# Patient Record
Sex: Female | Born: 1980 | Race: White | Hispanic: No | Marital: Married | Smoking: Never smoker
Health system: Southern US, Community
[De-identification: ages and names within clinical notes are randomized; demographics above are authoritative.]

## PROBLEM LIST (undated history)

## (undated) ENCOUNTER — Inpatient Hospital Stay (HOSPITAL_COMMUNITY): Payer: Self-pay

## (undated) DIAGNOSIS — L509 Urticaria, unspecified: Secondary | ICD-10-CM

## (undated) DIAGNOSIS — L309 Dermatitis, unspecified: Secondary | ICD-10-CM

## (undated) HISTORY — PX: WISDOM TOOTH EXTRACTION: SHX21

## (undated) HISTORY — DX: Urticaria, unspecified: L50.9

## (undated) HISTORY — PX: TYMPANOSTOMY TUBE PLACEMENT: SHX32

## (undated) HISTORY — DX: Dermatitis, unspecified: L30.9

---

## 2011-04-18 ENCOUNTER — Inpatient Hospital Stay (HOSPITAL_COMMUNITY): Admission: AD | Admit: 2011-04-18 | Payer: Self-pay | Admitting: Obstetrics and Gynecology

## 2011-04-26 ENCOUNTER — Inpatient Hospital Stay (HOSPITAL_COMMUNITY)
Admission: AD | Admit: 2011-04-26 | Discharge: 2011-04-29 | DRG: 775 | Disposition: A | Discharge status: Home / Self Care | Payer: Managed Care, Other (non HMO) | Source: Ambulatory Visit | Attending: Obstetrics and Gynecology | Admitting: Obstetrics and Gynecology

## 2011-04-26 DIAGNOSIS — O480 Post-term pregnancy: Principal | ICD-10-CM | POA: Diagnosis present

## 2011-04-26 LAB — CBC
HCT: 32.7 % — ABNORMAL LOW (ref 36.0–46.0)
Hemoglobin: 10.9 g/dL — ABNORMAL LOW (ref 12.0–15.0)
MCH: 28.5 pg (ref 26.0–34.0)
MCHC: 33.3 g/dL (ref 30.0–36.0)
MCV: 85.6 fL (ref 78.0–100.0)

## 2011-04-28 LAB — CBC
HCT: 28.5 % — ABNORMAL LOW (ref 36.0–46.0)
MCH: 28 pg (ref 26.0–34.0)
MCV: 86.9 fL (ref 78.0–100.0)
RDW: 13.7 % (ref 11.5–15.5)
WBC: 12.1 10*3/uL — ABNORMAL HIGH (ref 4.0–10.5)

## 2014-12-14 NOTE — L&D Delivery Note (Signed)
Pt was admitted in early labor. She had AROM with clear fluid. She had pit aug. She completed the first stage without difficulty. She pushed breifly and had a SVD of one live viable white female infant over a second degree midline tear in the ROA position. Placenta-M/I. EBL-400cc. Baby to nbn. Tear closed with 3-0 chromic.

## 2015-03-05 LAB — OB RESULTS CONSOLE GC/CHLAMYDIA
CHLAMYDIA, DNA PROBE: NEGATIVE
GC PROBE AMP, GENITAL: NEGATIVE

## 2015-04-03 LAB — OB RESULTS CONSOLE HEPATITIS B SURFACE ANTIGEN: HEP B S AG: NEGATIVE

## 2015-04-03 LAB — OB RESULTS CONSOLE ANTIBODY SCREEN: ANTIBODY SCREEN: NEGATIVE

## 2015-04-03 LAB — OB RESULTS CONSOLE RUBELLA ANTIBODY, IGM: Rubella: IMMUNE

## 2015-04-03 LAB — OB RESULTS CONSOLE ABO/RH: RH Type: POSITIVE

## 2015-04-03 LAB — OB RESULTS CONSOLE RPR: RPR: NONREACTIVE

## 2015-04-03 LAB — OB RESULTS CONSOLE HIV ANTIBODY (ROUTINE TESTING): HIV: NONREACTIVE

## 2015-08-21 ENCOUNTER — Encounter (HOSPITAL_COMMUNITY): Payer: Self-pay | Admitting: *Deleted

## 2015-08-21 ENCOUNTER — Inpatient Hospital Stay (HOSPITAL_COMMUNITY)
Admission: AD | Admit: 2015-08-21 | Discharge: 2015-08-22 | Disposition: A | Discharge status: Home / Self Care | Payer: BC Managed Care – PPO | Source: Ambulatory Visit | Attending: Obstetrics and Gynecology | Admitting: Obstetrics and Gynecology

## 2015-08-21 DIAGNOSIS — Z3A31 31 weeks gestation of pregnancy: Secondary | ICD-10-CM | POA: Insufficient documentation

## 2015-08-21 DIAGNOSIS — R12 Heartburn: Secondary | ICD-10-CM | POA: Insufficient documentation

## 2015-08-21 DIAGNOSIS — O26893 Other specified pregnancy related conditions, third trimester: Secondary | ICD-10-CM

## 2015-08-21 DIAGNOSIS — O219 Vomiting of pregnancy, unspecified: Secondary | ICD-10-CM

## 2015-08-21 DIAGNOSIS — O212 Late vomiting of pregnancy: Secondary | ICD-10-CM | POA: Diagnosis present

## 2015-08-21 LAB — COMPREHENSIVE METABOLIC PANEL
ALBUMIN: 3.2 g/dL — AB (ref 3.5–5.0)
ALK PHOS: 73 U/L (ref 38–126)
ALT: 37 U/L (ref 14–54)
ANION GAP: 12 (ref 5–15)
AST: 36 U/L (ref 15–41)
BILIRUBIN TOTAL: 0.5 mg/dL (ref 0.3–1.2)
BUN: 9 mg/dL (ref 6–20)
CALCIUM: 8.9 mg/dL (ref 8.9–10.3)
CO2: 20 mmol/L — AB (ref 22–32)
Chloride: 105 mmol/L (ref 101–111)
Creatinine, Ser: 0.52 mg/dL (ref 0.44–1.00)
GFR calc Af Amer: >60 mL/min (ref >60)
GFR calc non Af Amer: >60 mL/min (ref >60)
GLUCOSE: 95 mg/dL (ref 65–99)
POTASSIUM: 3.7 mmol/L (ref 3.5–5.1)
SODIUM: 137 mmol/L (ref 135–145)
TOTAL PROTEIN: 6.6 g/dL (ref 6.5–8.1)

## 2015-08-21 LAB — CBC WITH DIFFERENTIAL/PLATELET
BASOS ABS: 0 10*3/uL (ref 0.0–0.1)
BASOS PCT: 0 % (ref 0–1)
EOS ABS: 0.2 10*3/uL (ref 0.0–0.7)
Eosinophils Relative: 2 % (ref 0–5)
HEMATOCRIT: 33.5 % — AB (ref 36.0–46.0)
HEMOGLOBIN: 11.3 g/dL — AB (ref 12.0–15.0)
Lymphocytes Relative: 9 % — ABNORMAL LOW (ref 12–46)
Lymphs Abs: 0.9 10*3/uL (ref 0.7–4.0)
MCH: 29.7 pg (ref 26.0–34.0)
MCHC: 33.7 g/dL (ref 30.0–36.0)
MCV: 87.9 fL (ref 78.0–100.0)
Monocytes Absolute: 0.4 10*3/uL (ref 0.1–1.0)
Monocytes Relative: 4 % (ref 3–12)
NEUTROS ABS: 9.5 10*3/uL — AB (ref 1.7–7.7)
NEUTROS PCT: 85 % — AB (ref 43–77)
Platelets: 224 10*3/uL (ref 150–400)
RBC: 3.81 MIL/uL — ABNORMAL LOW (ref 3.87–5.11)
RDW: 19.3 % — ABNORMAL HIGH (ref 11.5–15.5)
WBC: 11 10*3/uL — AB (ref 4.0–10.5)

## 2015-08-21 LAB — URINE MICROSCOPIC-ADD ON

## 2015-08-21 LAB — URINALYSIS, ROUTINE W REFLEX MICROSCOPIC
BILIRUBIN URINE: NEGATIVE
Glucose, UA: NEGATIVE mg/dL
Hgb urine dipstick: NEGATIVE
LEUKOCYTES UA: NEGATIVE
NITRITE: NEGATIVE
PROTEIN: 30 mg/dL — AB
Specific Gravity, Urine: >1.03 — ABNORMAL HIGH (ref 1.005–1.030)
UROBILINOGEN UA: 0.2 mg/dL (ref 0.0–1.0)
pH: 6 (ref 5.0–8.0)

## 2015-08-21 MED ORDER — FAMOTIDINE IN NACL 20-0.9 MG/50ML-% IV SOLN
20.0000 mg | Freq: Once | INTRAVENOUS | Status: AC
  Administered 2015-08-21: 20 mg via INTRAVENOUS
  Filled 2015-08-21: qty 50

## 2015-08-21 MED ORDER — LACTATED RINGERS IV BOLUS (SEPSIS)
1000.0000 mL | Freq: Once | INTRAVENOUS | Status: AC
  Administered 2015-08-21: 1000 mL via INTRAVENOUS

## 2015-08-21 MED ORDER — METOCLOPRAMIDE HCL 5 MG/ML IJ SOLN
10.0000 mg | Freq: Once | INTRAMUSCULAR | Status: AC
  Administered 2015-08-21: 10 mg via INTRAVENOUS
  Filled 2015-08-21: qty 2

## 2015-08-21 MED ORDER — ONDANSETRON HCL 4 MG/2ML IJ SOLN
4.0000 mg | Freq: Once | INTRAMUSCULAR | Status: AC
  Administered 2015-08-21: 4 mg via INTRAVENOUS
  Filled 2015-08-21: qty 2

## 2015-08-21 NOTE — MAU Note (Signed)
Pt states she has been vomiting since 1 pm. Denies diarrhea or fever.

## 2015-08-21 NOTE — MAU Provider Note (Signed)
History     CSN: [id number]  Arrival date and time: 08/21/15 2031   First Provider Initiated Contact with Patient 08/21/15 2123      No chief complaint on file.  HPI Charlotte Murray is a 34 y.o. G3P1011 at [redacted]w[redacted]d who presents to MAU today with complaint of N/V since 1300 today. She states that she had decreased appetite earlier in the pregnancy and was taking Diclegis. She has had minimal N/V. She denies change in diet or sick contacts. She does endorse intermittent heartburn aggravated by certain foods. She has tried pepcid chewables, but no pain medication. She denies fever, contractions, vaginal bleeding, LOF, UTI symptoms or complications with the pregnancy. She reports good fetal movement.   OB History    Gravida Para Term Preterm AB TAB SAB Ectopic Multiple Living   0 1 0 1 0 0 1      History reviewed. No pertinent past medical history.  History reviewed. No pertinent past surgical history.  No family history on file.  Social History  Substance Use Topics  . Smoking status: Never Smoker   . Smokeless tobacco: None  . Alcohol Use: No    Allergies: No Known Allergies  No prescriptions prior to admission    Review of Systems  Constitutional: Negative for fever and malaise/fatigue.  Gastrointestinal: Positive for nausea, vomiting and abdominal pain. Negative for diarrhea and constipation.  Genitourinary: Negative for dysuria, urgency and frequency.       Neg - vaginal bleeding, discharge, LOF   Physical Exam   Blood pressure 113/69, pulse 113, temperature 98.5 F (36.9 C), temperature source Oral, resp. rate 16, height  (1.6 m), weight 197 lb (89.359 kg), last menstrual period 08/18/2015, SpO2 99 %.  Physical Exam  Nursing note and vitals reviewed. Constitutional: She is oriented to person, place, and time. She appears well-developed and well-nourished. No distress.  HENT:  Head: Normocephalic and atraumatic.  Cardiovascular: Normal rate.    Respiratory: Effort normal.  GI: Soft. Bowel sounds are normal. She exhibits no distension and no mass. There is no tenderness. There is no rebound and no guarding.  Neurological: She is alert and oriented to person, place, and time.  Skin: Skin is warm and dry. No erythema.  Psychiatric: She has a normal mood and affect.    Results for orders placed or performed during the hospital encounter of 08/21/15 (from the past 24 hour(s))  Urinalysis, Routine w reflex microscopic (not at [facility])     Status: Abnormal   Collection Time: 08/21/15  8:52 PM  Result Value Ref Range   Color, Urine YELLOW YELLOW   APPearance CLEAR CLEAR   Specific Gravity, Urine >1.030 (H) 1.005 - 1.030   pH 6.0 5.0 - 8.0   Glucose, UA NEGATIVE NEGATIVE mg/dL   Hgb urine dipstick NEGATIVE NEGATIVE   Bilirubin Urine NEGATIVE NEGATIVE   Ketones, ur >80 (A) NEGATIVE mg/dL   Protein, ur 30 (A) NEGATIVE mg/dL   Urobilinogen, UA 0.2 0.0 - 1.0 mg/dL   Nitrite NEGATIVE NEGATIVE   Leukocytes, UA NEGATIVE NEGATIVE  Urine microscopic-add on     Status: Abnormal   Collection Time: 08/21/15  8:52 PM  Result Value Ref Range   Squamous Epithelial / LPF FEW (A) RARE   WBC, UA 0-2 <3 WBC/hpf   RBC / HPF 0-2 <3 RBC/hpf   Bacteria, UA MANY (A) RARE   Urine-Other MUCOUS PRESENT   CBC with Differential/Platelet     Status: Abnormal  Collection Time: 08/21/15  9:43 PM  Result Value Ref Range   WBC 11.0 (H) 4.0 - 10.5 K/uL   RBC 3.81 (L) 3.87 - 5.11 MIL/uL   Hemoglobin 11.3 (L) 12.0 - 15.0 g/dL   HCT 46.9 (L) 62.9 - 52.8 %   MCV 87.9 78.0 - 100.0 fL   MCH 29.7 26.0 - 34.0 pg   MCHC 33.7 30.0 - 36.0 g/dL   RDW 41.3 (H) 24.4 - 01.0 %   Platelets 224 150 - 400 K/uL   Neutrophils Relative % 85 (H) 43 - 77 %   Neutro Abs 9.5 (H) 1.7 - 7.7 K/uL   Lymphocytes Relative 9 (L) 12 - 46 %   Lymphs Abs 0.9 0.7 - 4.0 K/uL   Monocytes Relative 4 3 - 12 %   Monocytes Absolute 0.4 0.1 - 1.0 K/uL   Eosinophils Relative 2 0 - 5 %    Eosinophils Absolute 0.2 0.0 - 0.7 K/uL   Basophils Relative 0 0 - 1 %   Basophils Absolute 0.0 0.0 - 0.1 K/uL  Comprehensive metabolic panel     Status: Abnormal   Collection Time: 08/21/15  9:43 PM  Result Value Ref Range   Sodium 137 135 - 145 mmol/L   Potassium 3.7 3.5 - 5.1 mmol/L   Chloride 105 101 - 111 mmol/L   CO2 20 (L) 22 - 32 mmol/L   Glucose, Bld 95 65 - 99 mg/dL   BUN 9 6 - 20 mg/dL   Creatinine, Ser 2.72 0.44 - 1.00 mg/dL   Calcium 8.9 8.9 - 53.6 mg/dL   Total Protein 6.6 6.5 - 8.1 g/dL   Albumin 3.2 (L) 3.5 - 5.0 g/dL   AST 36 15 - 41 U/L   ALT 37 14 - 54 U/L   Alkaline Phosphatase 73 38 - 126 U/L   Total Bilirubin 0.5 0.3 - 1.2 mg/dL   GFR calc non Af Amer >60 >60 mL/min   GFR calc Af Amer >60 >60 mL/min   Anion gap 12 5 - 15   Fetal Monitoring: Baseline: 135 bpm, moderate variability, + accelerations, no decelerations Contractions: none  MAU Course  Procedures None  MDM IV LR with 4 mg IV Zofran and 20 mg IV Pepcid given Patient reports some improvement in symptoms, but continued N/V Reglan IV given.  Patient states resolution of nausea, but has continued to have small amount of emesis.  Discussed patient with Dr. Claiborne Billings. She recommends Rx for Reglan and continued Pepcid OTC. Patient may follow-up as scheduled unless symptoms worsen or she develops fever.   Assessment and Plan  A: SIUP at [redacted]w[redacted]d Heartburn in pregnancy Nausea and vomiting in pregnancy  P: Discharge home Rx for Reglan given to patient Advised continued OTC Pepcid Warning signs for worsening condition discussed Patient advised to follow-up with [facility] as scheduled for routine prenatal care or sooner PRN Patient may return to MAU as needed or if her condition were to change or worsen   Marny Lowenstein, PA-C  08/22/2015, 2:30 AM

## 2015-08-22 DIAGNOSIS — O219 Vomiting of pregnancy, unspecified: Secondary | ICD-10-CM

## 2015-08-22 MED ORDER — METOCLOPRAMIDE HCL 10 MG PO TABS: 10.0000 mg | ORAL_TABLET | Freq: Four times a day (QID) | ORAL | Status: DC

## 2015-08-22 NOTE — Discharge Instructions (Signed)
Eating Plan for Hyperemesis Gravidarum  Severe cases of hyperemesis gravidarum can lead to dehydration and malnutrition. The hyperemesis eating plan is one way to lessen the symptoms of nausea and vomiting. It is often used with prescribed medicines to control your symptoms.   WHAT CAN I DO TO RELIEVE MY SYMPTOMS?  Listen to your body. Everyone is different and has different preferences. Find what works best for you. Some of the following things may help:  · Eat and drink slowly.  · Eat 5-6 small meals daily instead of 3 large meals.    · Eat crackers before you get out of bed in the morning.    · Starchy foods are usually well tolerated (such as cereal, toast, bread, potatoes, pasta, rice, and pretzels).    · Ginger may help with nausea. Add ¼ tsp ground ginger to hot tea or choose ginger tea.    · Try drinking 100% fruit juice or an electrolyte drink.  · Continue to take your prenatal vitamins as directed by your health care provider. If you are having trouble taking your prenatal vitamins, talk with your health care provider about different options.  · Include at least 1 serving of protein with your meals and snacks (such as meats or poultry, beans, nuts, eggs, or yogurt). Try eating a protein-rich snack before bed (such as cheese and crackers or a half turkey or peanut butter sandwich).  WHAT THINGS SHOULD I AVOID TO REDUCE MY SYMPTOMS?  The following things may help reduce your symptoms:  · Avoid foods with strong smells. Try eating meals in well-ventilated areas that are free of odors.  · Avoid drinking water or other beverages with meals. Try not to drink anything less than 30 minutes before and after meals.  · Avoid drinking more than 1 cup of fluid at a time.  · Avoid fried or high-fat foods, such as butter and cream sauces.  · Avoid spicy foods.  · Avoid skipping meals the best you can. Nausea can be more intense on an empty stomach. If you cannot tolerate food at that time, do not force it. Try sucking on  ice chips or other frozen items and make up the calories later.  · Avoid lying down within 2 hours after eating.  Document Released: 09/27/2007 Document Revised: 12/05/2013 Document Reviewed: 10/04/2013  ExitCare® Patient Information ©2015 [employer]. This information is not intended to replace advice given to you by your health care provider. Make sure you discuss any questions you have with your health care provider.

## 2015-09-18 LAB — OB RESULTS CONSOLE GBS: GBS: NEGATIVE

## 2015-09-21 ENCOUNTER — Encounter (HOSPITAL_COMMUNITY): Payer: Self-pay | Admitting: *Deleted

## 2015-09-21 ENCOUNTER — Inpatient Hospital Stay (HOSPITAL_COMMUNITY)
Admission: AD | Admit: 2015-09-21 | Discharge: 2015-09-21 | Disposition: A | Discharge status: Home / Self Care | Payer: BC Managed Care – PPO | Source: Ambulatory Visit | Attending: Obstetrics and Gynecology | Admitting: Obstetrics and Gynecology

## 2015-09-21 DIAGNOSIS — Z3493 Encounter for supervision of normal pregnancy, unspecified, third trimester: Secondary | ICD-10-CM | POA: Diagnosis present

## 2015-09-21 LAB — URINALYSIS, ROUTINE W REFLEX MICROSCOPIC
Bilirubin Urine: NEGATIVE
Glucose, UA: NEGATIVE mg/dL
HGB URINE DIPSTICK: NEGATIVE
Ketones, ur: NEGATIVE mg/dL
Leukocytes, UA: NEGATIVE
Nitrite: NEGATIVE
Protein, ur: NEGATIVE mg/dL
UROBILINOGEN UA: 0.2 mg/dL (ref 0.0–1.0)
pH: 6.5 (ref 5.0–8.0)

## 2015-09-21 NOTE — MAU Note (Signed)
Having some pelvic pressure and occ contractions. Tonight lost mucous plug.

## 2015-10-12 ENCOUNTER — Encounter (HOSPITAL_COMMUNITY): Payer: Self-pay | Admitting: *Deleted

## 2015-10-12 ENCOUNTER — Inpatient Hospital Stay (HOSPITAL_COMMUNITY)
Admission: AD | Admit: 2015-10-12 | Discharge: 2015-10-14 | DRG: 775 | Disposition: A | Discharge status: Home / Self Care | Payer: BC Managed Care – PPO | Source: Ambulatory Visit | Attending: Obstetrics and Gynecology | Admitting: Obstetrics and Gynecology

## 2015-10-12 ENCOUNTER — Inpatient Hospital Stay (HOSPITAL_COMMUNITY): Payer: BC Managed Care – PPO | Admitting: Anesthesiology

## 2015-10-12 DIAGNOSIS — Z3A39 39 weeks gestation of pregnancy: Secondary | ICD-10-CM

## 2015-10-12 DIAGNOSIS — Z3480 Encounter for supervision of other normal pregnancy, unspecified trimester: Secondary | ICD-10-CM

## 2015-10-12 LAB — CBC
HEMATOCRIT: 37.4 % (ref 36.0–46.0)
HEMOGLOBIN: 12.9 g/dL (ref 12.0–15.0)
MCH: 30.5 pg (ref 26.0–34.0)
MCHC: 34.5 g/dL (ref 30.0–36.0)
MCV: 88.4 fL (ref 78.0–100.0)
Platelets: 187 10*3/uL (ref 150–400)
RBC: 4.23 MIL/uL (ref 3.87–5.11)
RDW: 15.5 % (ref 11.5–15.5)
WBC: 12.7 10*3/uL — ABNORMAL HIGH (ref 4.0–10.5)

## 2015-10-12 LAB — TYPE AND SCREEN
ABO/RH(D): O POS
ANTIBODY SCREEN: NEGATIVE

## 2015-10-12 LAB — ABO/RH: ABO/RH(D): O POS

## 2015-10-12 MED ORDER — OXYTOCIN 40 UNITS IN LACTATED RINGERS INFUSION - SIMPLE MED
62.5000 mL/h | INTRAVENOUS | Status: DC
  Filled 2015-10-12 (×2): qty 1000

## 2015-10-12 MED ORDER — OXYTOCIN BOLUS FROM INFUSION: 500.0000 mL | INTRAVENOUS | Status: DC

## 2015-10-12 MED ORDER — CITRIC ACID-SODIUM CITRATE 334-500 MG/5ML PO SOLN: 30.0000 mL | ORAL | Status: DC | PRN

## 2015-10-12 MED ORDER — LACTATED RINGERS IV SOLN
INTRAVENOUS | Status: DC
  Administered 2015-10-12: 16:00:00 via INTRAVENOUS

## 2015-10-12 MED ORDER — DIPHENHYDRAMINE HCL 50 MG/ML IJ SOLN: 12.5000 mg | INTRAMUSCULAR | Status: DC | PRN

## 2015-10-12 MED ORDER — PHENYLEPHRINE 40 MCG/ML (10ML) SYRINGE FOR IV PUSH (FOR BLOOD PRESSURE SUPPORT)
80.0000 ug | PREFILLED_SYRINGE | INTRAVENOUS | Status: DC | PRN
  Filled 2015-10-12: qty 2
  Filled 2015-10-12: qty 20

## 2015-10-12 MED ORDER — LIDOCAINE-EPINEPHRINE (PF) 2 %-1:200000 IJ SOLN
INTRAMUSCULAR | Status: DC | PRN
  Administered 2015-10-12: 4 mL

## 2015-10-12 MED ORDER — ACETAMINOPHEN 325 MG PO TABS: 650.0000 mg | ORAL_TABLET | ORAL | Status: DC | PRN

## 2015-10-12 MED ORDER — ONDANSETRON HCL 4 MG/2ML IJ SOLN: 4.0000 mg | Freq: Four times a day (QID) | INTRAMUSCULAR | Status: DC | PRN

## 2015-10-12 MED ORDER — OXYCODONE-ACETAMINOPHEN 5-325 MG PO TABS: 1.0000 | ORAL_TABLET | ORAL | Status: DC | PRN

## 2015-10-12 MED ORDER — OXYTOCIN 40 UNITS IN LACTATED RINGERS INFUSION - SIMPLE MED
1.0000 m[IU]/min | INTRAVENOUS | Status: DC
  Administered 2015-10-12: 2 m[IU]/min via INTRAVENOUS
  Administered 2015-10-12: 4 m[IU]/min via INTRAVENOUS

## 2015-10-12 MED ORDER — OXYCODONE-ACETAMINOPHEN 5-325 MG PO TABS: 2.0000 | ORAL_TABLET | ORAL | Status: DC | PRN

## 2015-10-12 MED ORDER — TERBUTALINE SULFATE 1 MG/ML IJ SOLN
0.2500 mg | Freq: Once | INTRAMUSCULAR | Status: DC | PRN
  Filled 2015-10-12: qty 1

## 2015-10-12 MED ORDER — LACTATED RINGERS IV SOLN
500.0000 mL | INTRAVENOUS | Status: DC | PRN
Start: 2015-10-12 — End: 2015-10-13
  Administered 2015-10-12: 500 mL via INTRAVENOUS

## 2015-10-12 MED ORDER — EPHEDRINE 5 MG/ML INJ
10.0000 mg | INTRAVENOUS | Status: DC | PRN
  Filled 2015-10-12: qty 2

## 2015-10-12 MED ORDER — BUPIVACAINE HCL (PF) 0.25 % IJ SOLN
INTRAMUSCULAR | Status: DC | PRN
  Administered 2015-10-12 (×2): 4 mL via EPIDURAL

## 2015-10-12 MED ORDER — FENTANYL 2.5 MCG/ML BUPIVACAINE 1/10 % EPIDURAL INFUSION (WH - ANES)
14.0000 mL/h | INTRAMUSCULAR | Status: DC | PRN
Start: 2015-10-12 — End: 2015-10-13
  Administered 2015-10-12: 14 mL/h via EPIDURAL
  Filled 2015-10-12: qty 125

## 2015-10-12 MED ORDER — LIDOCAINE HCL (PF) 1 % IJ SOLN
30.0000 mL | INTRAMUSCULAR | Status: DC | PRN
  Filled 2015-10-12: qty 30

## 2015-10-12 NOTE — H&P (Signed)
Pt is a 34 y/o white female, G3P1011 at term who presents to L&D in early labor. PNC was uncomplicated. She was 3 cm on admission PMHX: see PNR PE: VSSAF        HEENT-wnl        ABD-gravid, palp ctxs        FHTs reactive IMP/ IUP at term in labor Plan/ Admit

## 2015-10-12 NOTE — Anesthesia Preprocedure Evaluation (Signed)

## 2015-10-12 NOTE — Anesthesia Procedure Notes (Signed)
Epidural Patient location during procedure: OB  Staffing Anesthesiologist: Itzael Liptak Performed by: anesthesiologist   Preanesthetic Checklist Completed: patient identified, surgical consent, pre-op evaluation, timeout performed, IV checked, risks and benefits discussed and monitors and equipment checked  Epidural Patient position: sitting Prep: DuraPrep Patient monitoring: heart rate, cardiac monitor, continuous pulse ox and blood pressure Approach: midline Location: L3-L4 Injection technique: LOR saline  Needle:  Needle type: Tuohy  Needle gauge: 17 G Needle length: 9 cm Needle insertion depth: 9 cm Catheter type: closed end flexible Catheter size: 19 Gauge Catheter at skin depth: 15 cm Test dose: negative and 2% lidocaine with Epi 1:200 K  Assessment Events: blood not aspirated, injection not painful, no injection resistance, negative IV test and no paresthesia  Additional Notes Reason for block:procedure for pain   

## 2015-10-12 NOTE — Progress Notes (Signed)
Toco readjusted numerous times. Unable to trace contractions well. Palpation utilized for assessment.

## 2015-10-12 NOTE — Plan of Care (Signed)
Problem: Phase I Progression Outcomes Goal: Pitocin as ordered Outcome: Progressing Pitocin at 4mu for MVU 150

## 2015-10-13 DIAGNOSIS — Z3480 Encounter for supervision of other normal pregnancy, unspecified trimester: Secondary | ICD-10-CM

## 2015-10-13 LAB — RPR: RPR Ser Ql: NONREACTIVE

## 2015-10-13 LAB — HIV ANTIBODY (ROUTINE TESTING W REFLEX): HIV Screen 4th Generation wRfx: NONREACTIVE

## 2015-10-13 MED ORDER — ZOLPIDEM TARTRATE 5 MG PO TABS: 5.0000 mg | ORAL_TABLET | Freq: Every evening | ORAL | Status: DC | PRN

## 2015-10-13 MED ORDER — ONDANSETRON HCL 4 MG/2ML IJ SOLN: 4.0000 mg | INTRAMUSCULAR | Status: DC | PRN

## 2015-10-13 MED ORDER — OXYCODONE-ACETAMINOPHEN 5-325 MG PO TABS: 2.0000 | ORAL_TABLET | ORAL | Status: DC | PRN

## 2015-10-13 MED ORDER — SIMETHICONE 80 MG PO CHEW: 80.0000 mg | CHEWABLE_TABLET | ORAL | Status: DC | PRN

## 2015-10-13 MED ORDER — OXYCODONE-ACETAMINOPHEN 5-325 MG PO TABS: 1.0000 | ORAL_TABLET | ORAL | Status: DC | PRN

## 2015-10-13 MED ORDER — BENZOCAINE-MENTHOL 20-0.5 % EX AERO
1.0000 "application " | INHALATION_SPRAY | CUTANEOUS | Status: DC | PRN
  Administered 2015-10-13 – 2015-10-14 (×2): 1 via TOPICAL
  Filled 2015-10-13 (×2): qty 56

## 2015-10-13 MED ORDER — DIPHENOXYLATE-ATROPINE 2.5-0.025 MG PO TABS
1.0000 | ORAL_TABLET | Freq: Four times a day (QID) | ORAL | Status: DC | PRN
  Administered 2015-10-13: 1 via ORAL
  Filled 2015-10-13: qty 1

## 2015-10-13 MED ORDER — METHYLERGONOVINE MALEATE 0.2 MG PO TABS
0.2000 mg | ORAL_TABLET | Freq: Four times a day (QID) | ORAL | Status: AC
  Administered 2015-10-13 – 2015-10-14 (×6): 0.2 mg via ORAL
  Filled 2015-10-13 (×7): qty 1

## 2015-10-13 MED ORDER — ONDANSETRON HCL 4 MG PO TABS: 4.0000 mg | ORAL_TABLET | ORAL | Status: DC | PRN

## 2015-10-13 MED ORDER — MEASLES, MUMPS & RUBELLA VAC ~~LOC~~ INJ
0.5000 mL | INJECTION | Freq: Once | SUBCUTANEOUS | Status: DC
  Filled 2015-10-13: qty 0.5

## 2015-10-13 MED ORDER — TETANUS-DIPHTH-ACELL PERTUSSIS 5-2.5-18.5 LF-MCG/0.5 IM SUSP: 0.5000 mL | Freq: Once | INTRAMUSCULAR | Status: DC

## 2015-10-13 MED ORDER — ACETAMINOPHEN 325 MG PO TABS: 650.0000 mg | ORAL_TABLET | ORAL | Status: DC | PRN

## 2015-10-13 MED ORDER — SENNOSIDES-DOCUSATE SODIUM 8.6-50 MG PO TABS
2.0000 | ORAL_TABLET | ORAL | Status: DC
  Administered 2015-10-13: 2 via ORAL
  Filled 2015-10-13: qty 2

## 2015-10-13 MED ORDER — DIBUCAINE 1 % RE OINT: 1.0000 "application " | TOPICAL_OINTMENT | RECTAL | Status: DC | PRN

## 2015-10-13 MED ORDER — IBUPROFEN 600 MG PO TABS
600.0000 mg | ORAL_TABLET | Freq: Four times a day (QID) | ORAL | Status: DC
  Administered 2015-10-13 – 2015-10-14 (×7): 600 mg via ORAL
  Filled 2015-10-13 (×8): qty 1

## 2015-10-13 MED ORDER — WITCH HAZEL-GLYCERIN EX PADS: 1.0000 "application " | MEDICATED_PAD | CUTANEOUS | Status: DC | PRN

## 2015-10-13 NOTE — Anesthesia Postprocedure Evaluation (Signed)
Anesthesia Post Note  Patient: Therapist, nutritionalCrystal Murray  Procedure(s) Performed: * No procedures listed *  Anesthesia type: Epidural  Patient location: Mother/Baby  Post pain: Pain level controlled  Post assessment: Post-op Vital signs reviewed  Last Vitals:  Filed Vitals:   10/13/15 0530  BP: 121/48  Pulse:   Temp: 36.6 C  Resp: 18    Post vital signs: Reviewed  Level of consciousness:alert  Complications: No apparent anesthesia complications

## 2015-10-13 NOTE — Lactation Note (Signed)
This note was copied from the chart of Charlotte Murray. Lactation Consultation Note: Experienced BF mom reports baby just finished feeding for about 30 min on and off. He is asleep in bassinet at this time. Reports he is latching well but does move and slide to tip of nipple sometimes. Encouraged to support breast and keep him close to the breast throughout the feeding. No questions at present. Mom heading to bathroom. Bf brochure given with resources for support after DC. To call prn  Patient Name: Charlotte Murray Today's Date: 10/13/2015 Reason for consult: Initial assessment   Maternal Data Formula Feeding for Exclusion: No Does the patient have breastfeeding experience prior to this delivery?: Yes  Feeding Feeding Type: Breast Fed  LATCH Score/Interventions                      Lactation Tools Discussed/Used     Consult Status Consult Status: Follow-up Date: 10/14/15 Follow-up type: In-patient    Pamelia HoitWeeks, Domenic Schoenberger D 10/13/2015, 11:02 AM

## 2015-10-13 NOTE — Progress Notes (Signed)
Patient has a moderate lochia without clots. Uterus boggy but became firm with massage after patient voided. Dr Dareen PianoAnderson called and Methergine series ordered.

## 2015-10-13 NOTE — Progress Notes (Signed)
PPD#1 Pt without c/o. Nursery would not allow circ as Peds has not seen baby yet VSSAF IMP/ Stable Plan/ Routine care.

## 2015-10-14 MED ORDER — OXYCODONE-ACETAMINOPHEN 5-325 MG PO TABS: 1.0000 | ORAL_TABLET | Freq: Four times a day (QID) | ORAL | Status: AC | PRN

## 2015-10-14 MED ORDER — IBUPROFEN 600 MG PO TABS: 600.0000 mg | ORAL_TABLET | Freq: Four times a day (QID) | ORAL | Status: AC | PRN

## 2015-10-14 NOTE — Progress Notes (Signed)
Patient is doing well.  She is ambulating, voiding, tolerating PO.  Pain control is good.  Lochia is appropriate  Filed Vitals:   10/13/15 0530 10/13/15 1717 10/13/15 2310 10/14/15 0650  BP: 121/48 130/75 124/49 123/75  Pulse:  75 82 73  Temp: 97.8 F (36.6 C) 98 F (36.7 C) 98.6 F (37 C) 97.8 F (36.6 C)  TempSrc: Oral Oral Oral   Resp: 18 18 20 18   Height:      Weight:      SpO2:        NAD Fundus firm Ext: no edema  Lab Results  Component Value Date   WBC 12.7* 10/12/2015   HGB 12.9 10/12/2015   HCT 37.4 10/12/2015   MCV 88.4 10/12/2015   PLT 187 10/12/2015    --/--/O POS, O POS (10/29 1600)/RImmune  A/P 34 y.o. G3P2011 PPD#2 s/p TSVD. Routine care.   Expect d/c today.  Desires circumcision. Discussed r/b/a of the procedure. Reviewed that circumcision is an elective surgical procedure and not considered medically necessary. Reviewed the risks of the procedure including the risk of infection, bleeding, damage to surrounding structures, including scrotum, shaft, urethra and head of penis, and an undesired cosmetic effect requiring additional procedures for revision. Consent signed.      [facility] JamesonDYANNA GEFFEL [employer] Tim[employer] CompanyCLARK

## 2015-10-14 NOTE — Discharge Summary (Signed)
Obstetric Discharge Summary Reason for Admission: onset of labor Prenatal Procedures: none Intrapartum Procedures: spontaneous vaginal delivery Postpartum Procedures: none Complications-Operative and Postpartum: 2 degree perineal laceration HEMOGLOBIN  Date Value Ref Range Status  10/12/2015 12.9 12.0 - 15.0 g/dL Final   HCT  Date Value Ref Range Status  10/12/2015 37.4 36.0 - 46.0 % Final    Physical Exam:  General: alert, cooperative and appears stated age 55Lochia: appropriate Uterine Fundus: firm DVT Evaluation: No evidence of DVT seen on physical exam.  Discharge Diagnoses: Term Pregnancy-delivered  Discharge Information: Date: 10/14/2015 Activity: pelvic rest Diet: routine Medications: PNV, Ibuprofen and Percocet Condition: stable Instructions: refer to practice specific booklet Discharge to: home Follow-up Information    Follow up with Levi AlandANDERSON,MARK E, MD.   Specialty:  Obstetrics and Gynecology   Contact information:   [address] [place] [phone]       Newborn Data: Live born female  Birth Weight: 7 lb 8.8 oz (3425 g) APGAR: 8, 9  Home with mother.  Minna Dumire GEFFEL Moosa Bueche 10/14/2015, 8:23 AM

## 2015-10-14 NOTE — Lactation Note (Addendum)
This note was copied from the chart of Boy IntelCrystal Marasigan. Lactation Consultation Note  Patient Name: Boy Jodi MourningCrystal Kichline QMVHQ'IToday's Date: 10/14/2015 Reason for consult: Follow-up assessment  Follow-up consult at 7240 hours old; Mom is a P2 with experience breastfeeding first child.  GA 39.1; BW 7 lbs, 8.8 oz.  Weight loss 4% at 28 hrs at last night's weight check. Infant has breasfed x11 (10-60 min) + attempts x2 (0 min); voids-4 in 24 hours/ 6 life; stools-4 in 24 hours/ 5 life.  LS-8 by RN.  Infant asleep in mom's arm when entered room.   Mom reports breastfeeding well; reports initial pain with latching but states the pain goes away after a few seconds.  Discussed transient nipple pain.  Discussed doing a chin tug and checking for flanged lips with depth if pain does not subside.  Mom reports she has comfort gels but is currently not wearing; plans to take them home.  Encouraged hand expressing colostrum to put on nipples and areolas and then use comfort gels.   Mom asked for a HP for discharge to home.  HP given and demonstrated how to use.  Mom states she has insurance; encouraged mom to call insurance company and further inquire about DEBP benefits. Engorgement prevention discussed. Encouraged to call if questions or concerns after discharge.     Lactation Tools Discussed/Used Pump Review: Setup, frequency, and cleaning   Consult Status Consult Status: Complete    Lendon KaVann, Mataya Kilduff Walker 10/14/2015, 1:22 PM

## 2017-03-25 ENCOUNTER — Other Ambulatory Visit (HOSPITAL_COMMUNITY): Payer: Self-pay | Admitting: Internal Medicine

## 2017-03-25 DIAGNOSIS — G4452 New daily persistent headache (NDPH): Secondary | ICD-10-CM

## 2017-04-01 ENCOUNTER — Encounter (HOSPITAL_COMMUNITY): Payer: Self-pay

## 2017-04-01 ENCOUNTER — Ambulatory Visit (HOSPITAL_COMMUNITY)
Admission: RE | Admit: 2017-04-01 | Discharge: 2017-04-01 | Disposition: A | Discharge status: Home / Self Care | Payer: BC Managed Care – PPO | Source: Ambulatory Visit | Attending: Internal Medicine | Admitting: Internal Medicine

## 2017-04-01 DIAGNOSIS — G4452 New daily persistent headache (NDPH): Secondary | ICD-10-CM | POA: Diagnosis present

## 2017-04-01 DIAGNOSIS — J320 Chronic maxillary sinusitis: Secondary | ICD-10-CM | POA: Diagnosis not present

## 2017-04-01 DIAGNOSIS — R278 Other lack of coordination: Secondary | ICD-10-CM | POA: Insufficient documentation

## 2017-04-01 MED ORDER — IOPAMIDOL (ISOVUE-300) INJECTION 61%
INTRAVENOUS | Status: AC
  Administered 2017-04-01: 75 mL
  Filled 2017-04-01: qty 75

## 2017-10-13 IMAGING — CT CT HEAD WO/W CM
4 of 5 series · 15 of 47 positions shown, 17 images · IV contrast (APPLIED)
Comparison: None.

CLINICAL DATA: Sinus pressure and headache over the last 10 days.

EXAM:
CT HEAD WITHOUT AND WITH CONTRAST
TECHNIQUE: Contiguous axial images were obtained from the base of the skull
through the vertex without and with intravenous contrast
CONTRAST:  75 cc Nsovue-JRR

[Series 2: head wo · axial · 0.47mm/px · z∈[-112,-37]mm · 4 of 27 slices shown]
[im 6/27  brain]
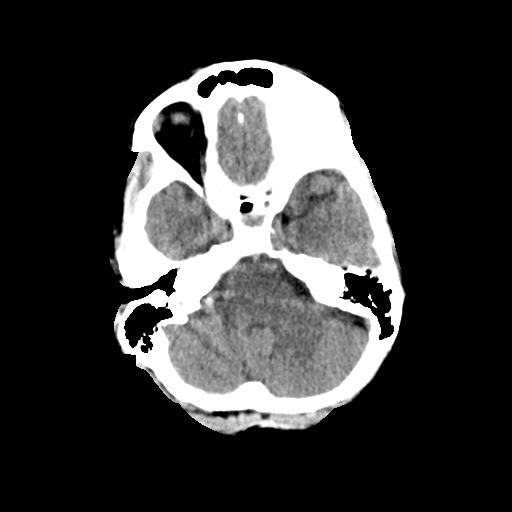
[im 11/27  brain]
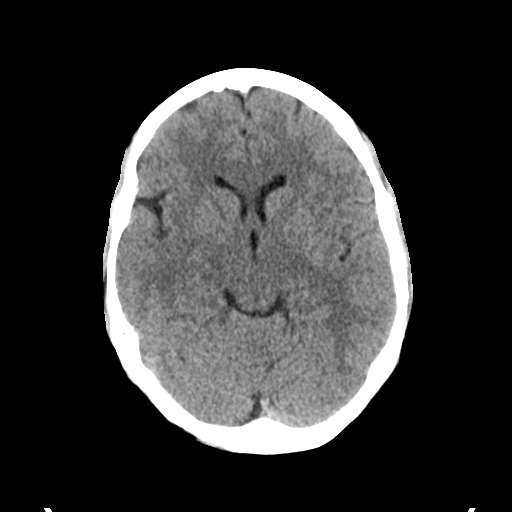
[im 16/27  brain]
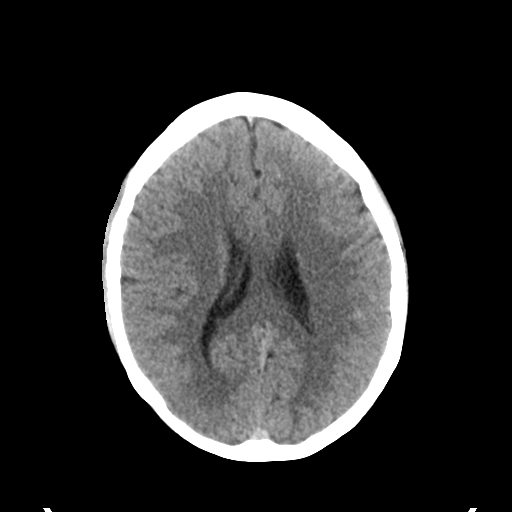
[im 21/27  brain]
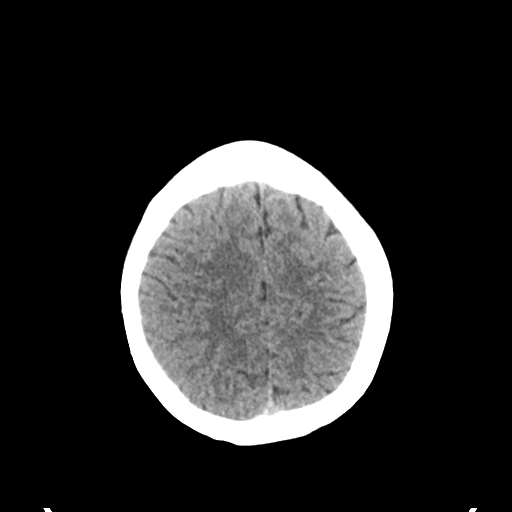

[Series 4: head w · axial · 0.47mm/px · z∈[-117,-32]mm · 5 of 27 slices shown, 7 images]
[im 5/27  brain]
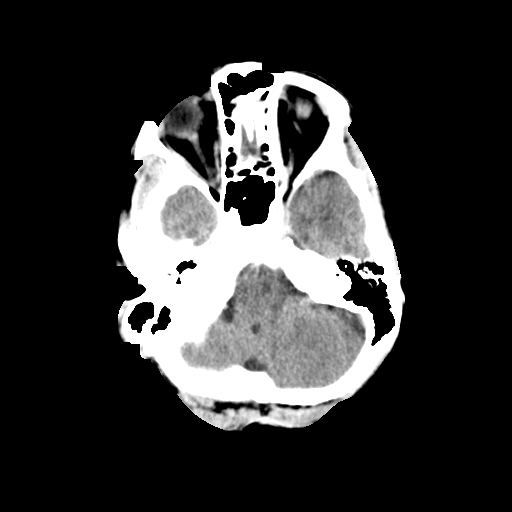
[im 5/27  bone]
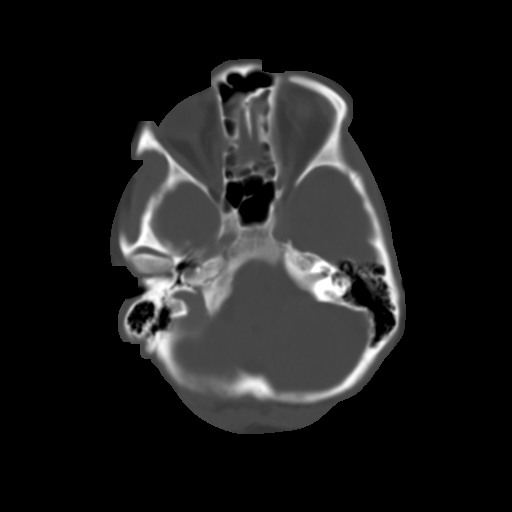
[im 9/27  brain]
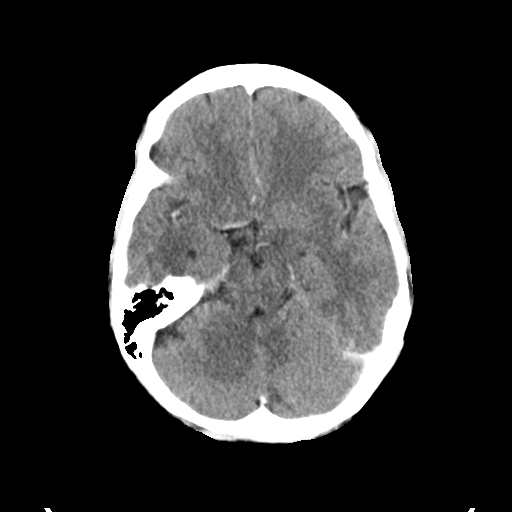
[im 14/27  brain]
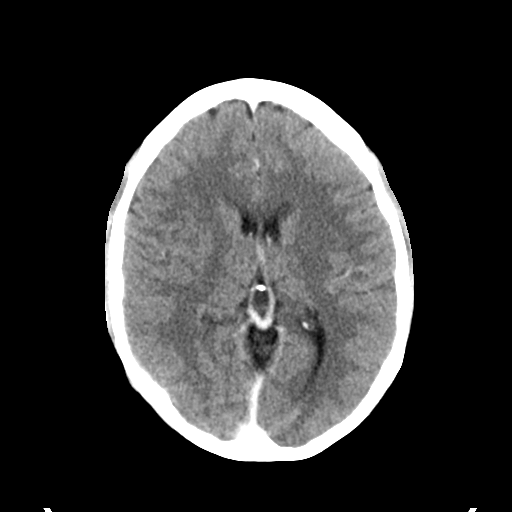
[im 18/27  brain]
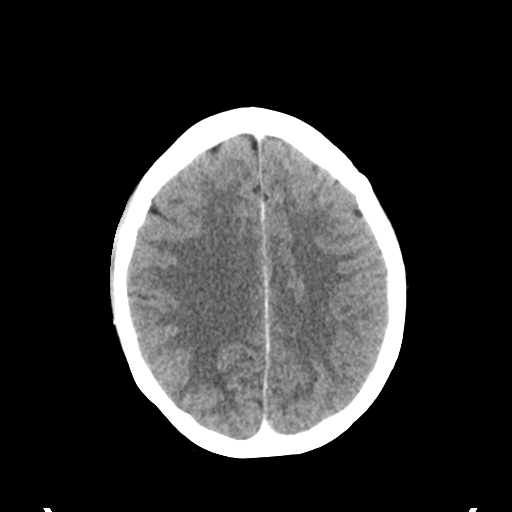
[im 22/27  brain]
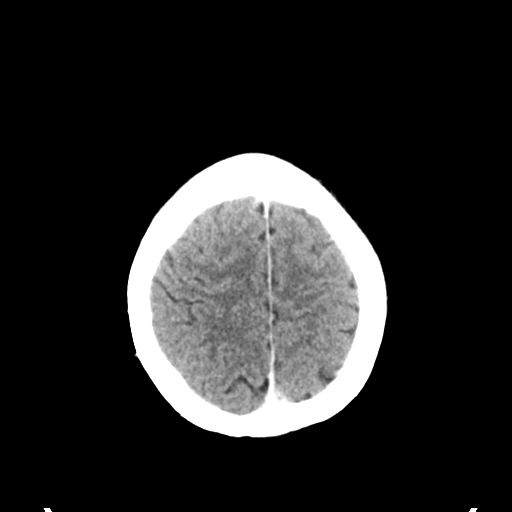
[im 22/27  bone]
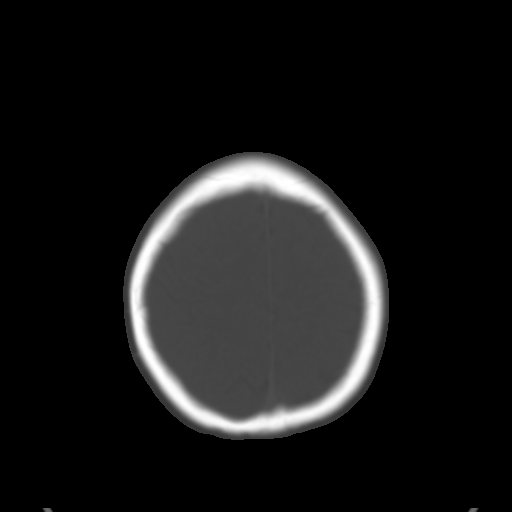

[Series 5: coronal soft tissue · coronal · 0.29mm/px · 3 of 72 slices shown]
[im 24/72  brain]
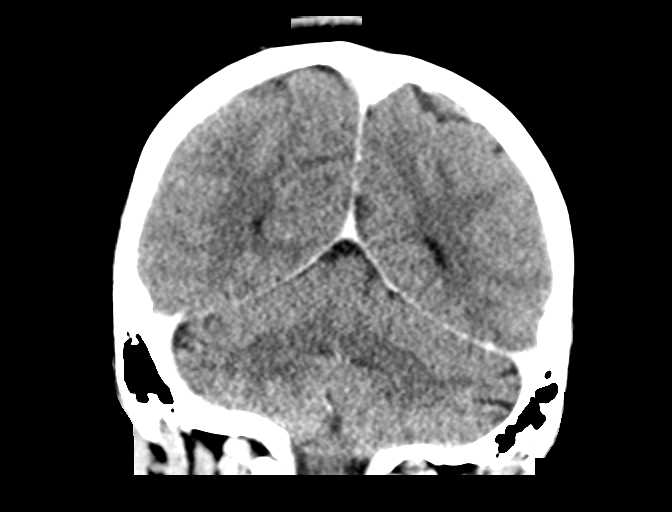
[im 32/72  brain]
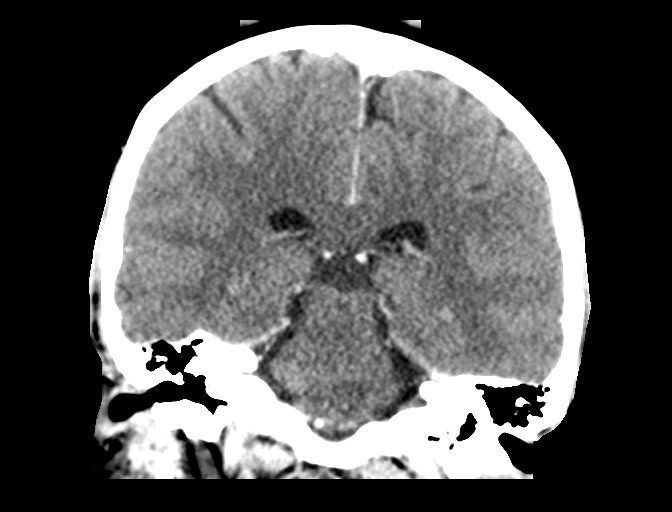
[im 40/72  brain]
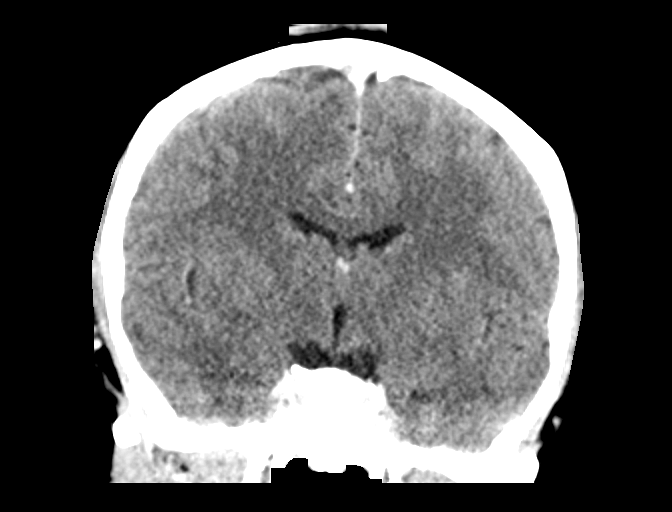

[Series 6: sagittal soft tissue · sagittal · 0.30mm/px · 3 of 57 slices shown]
[im 19/57  brain]
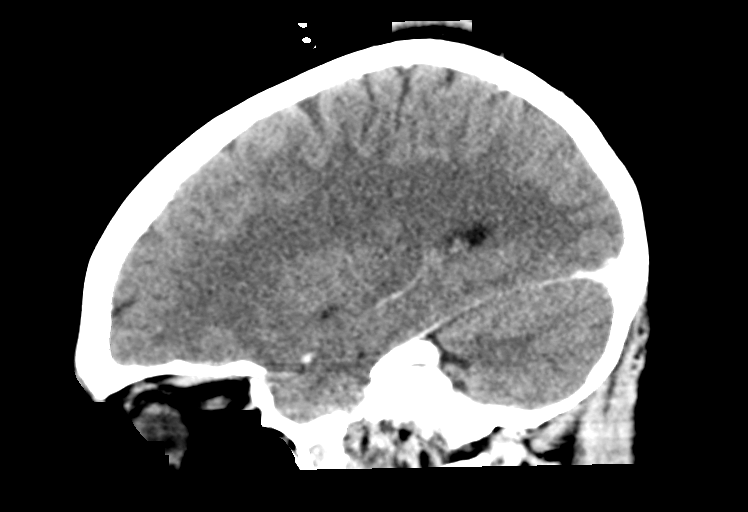
[im 29/57  brain]
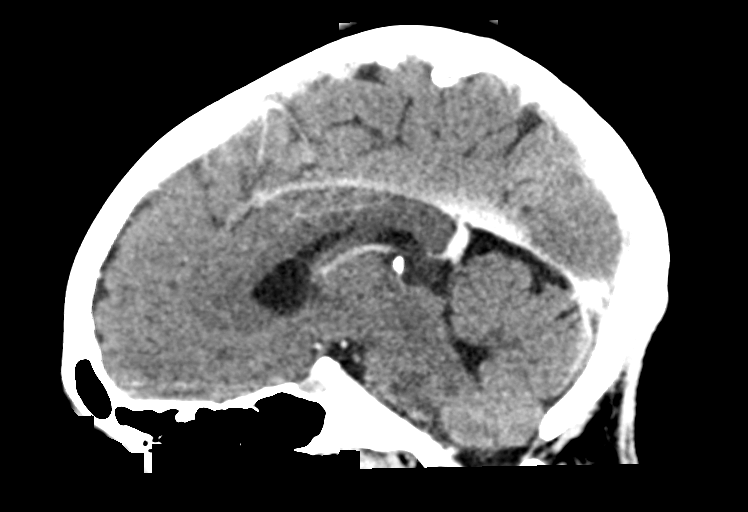
[im 38/57  brain]
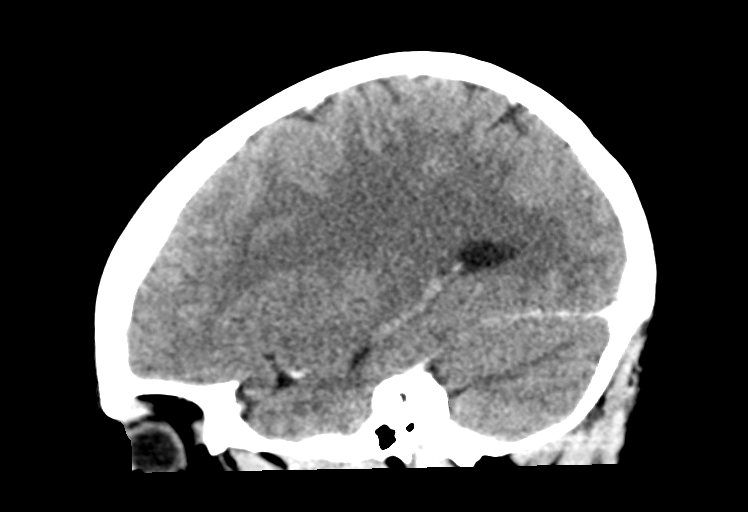

[15 of 47 positions shown; findings below may reference images not displayed]

FINDINGS: Brain: No evidence of malformation, atrophy, old or acute small or
large vessel infarction, mass lesion, hemorrhage, hydrocephalus or
extra-axial collection. No evidence of pituitary lesion. After
contrast administration, no abnormal enhancement occurs.

Vascular: No vascular calcification.  No hyperdense vessels.

Skull: Normal.  No fracture or focal bone lesion.

Sinuses/Orbits: Visualized sinuses are clear. Study does not include
the inferior aspect of the maxillary sinuses, but the upper portions
are clear as are the remainder of the paranasal sinuses. No fluid in
the middle ears or mastoids. Visualized orbits are normal.

Other: None significant
IMPRESSION: Normal examination. No abnormality seen to explain headache.
Visualized sinuses are normal.

## 2024-07-17 ENCOUNTER — Other Ambulatory Visit: Payer: Self-pay | Admitting: Obstetrics and Gynecology

## 2024-07-17 DIAGNOSIS — R928 Other abnormal and inconclusive findings on diagnostic imaging of breast: Secondary | ICD-10-CM

## 2024-07-20 ENCOUNTER — Other Ambulatory Visit: Payer: Self-pay

## 2024-07-20 ENCOUNTER — Inpatient Hospital Stay
Admission: RE | Admit: 2024-07-20 | Discharge: 2024-07-20 | Discharge status: Home / Self Care | Payer: Self-pay | Source: Ambulatory Visit | Attending: Obstetrics and Gynecology | Admitting: Obstetrics and Gynecology

## 2024-07-20 ENCOUNTER — Other Ambulatory Visit: Payer: Self-pay | Admitting: Obstetrics and Gynecology

## 2024-07-20 DIAGNOSIS — R928 Other abnormal and inconclusive findings on diagnostic imaging of breast: Secondary | ICD-10-CM

## 2024-07-20 DIAGNOSIS — R921 Mammographic calcification found on diagnostic imaging of breast: Secondary | ICD-10-CM

## 2024-09-04 ENCOUNTER — Encounter: Payer: Self-pay | Admitting: Internal Medicine

## 2024-09-04 ENCOUNTER — Other Ambulatory Visit: Payer: Self-pay

## 2024-09-04 ENCOUNTER — Ambulatory Visit: Payer: Self-pay | Admitting: Internal Medicine

## 2024-09-04 VITALS — BP 110/60 | HR 71 | Temp 98.1°F | Resp 16 | Ht 62.5 in | Wt 209.8 lb

## 2024-09-04 DIAGNOSIS — L309 Dermatitis, unspecified: Secondary | ICD-10-CM | POA: Insufficient documentation

## 2024-09-04 DIAGNOSIS — L308 Other specified dermatitis: Secondary | ICD-10-CM

## 2024-09-04 DIAGNOSIS — K200 Eosinophilic esophagitis: Secondary | ICD-10-CM | POA: Diagnosis not present

## 2024-09-04 DIAGNOSIS — J310 Chronic rhinitis: Secondary | ICD-10-CM | POA: Diagnosis not present

## 2024-09-04 MED ORDER — TRIAMCINOLONE ACETONIDE 0.1 % EX OINT: TOPICAL_OINTMENT | CUTANEOUS | 1 refills | Status: AC

## 2024-09-04 MED ORDER — TACROLIMUS 0.1 % EX OINT
TOPICAL_OINTMENT | Freq: Two times a day (BID) | CUTANEOUS | 3 refills | Status: DC | PRN
Start: 2024-09-04 — End: 2024-10-02

## 2024-09-04 NOTE — Patient Instructions (Addendum)
 Atopic dermatitis (eczema) with pruritus and lichenification Chronic atopic dermatitis with severe pruritus and lichenification, primarily affecting the legs, arms, and behind the knees. 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 Previous treatments with hydrocortisone and Kenalog  injection provided temporary relief. 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 Dupixent is considered for its efficacy in both conditions, with plans to administer it every other week given the controlled state of EOE. Daily Care For Maintenance (daily and continue even once eczema controlled) - Use hypoallergenic hydrating ointment at least twice daily.  This must be done daily for control of flares. (Great options include Vaseline, CeraVe, Aquaphor, Aveeno, Cetaphil, VaniCream, etc) - Avoid detergents, soaps or lotions with fragrances/dyes - Limit showers/baths to 5 minutes and use luke warm water instead of hot, pat dry following baths, and apply moisturizer - can use steroid/non-steroid therapy creams as detailed below up to twice weekly for prevention of flares.  For Flares:(add this to maintenance therapy if needed for flares) First apply steroid/non-steroid treatment creams. Wait 5 minutes then apply moisturizer.  - Triamcinolone  0.1% to body for moderate flares-apply topically twice daily to red, raised areas of skin, followed by moisturizer. Do NOT use on face, groin or armpits. - Hydrocortisone 2.5% to face/body-apply topically twice daily to red, raised areas of skin, followed by moisturizer - okay to use zyrtec 10 mg daily as needed for itching - return for environmental allergy testing -consider dupixent injections if not controlled on above  Eosinophilic esophagitis, controlled on omeprazole Eosinophilic esophagitis diagnosed in 2017, currently well-controlled on 20 mg omeprazole daily. Symptoms include dysphagia with  specific triggers (tuna and bananas), which are avoided. No recent exacerbations or need for esophageal dilation since starting omeprazole. Dupixent is considered for its potential to treat both EOE and eczema, but omeprazole will be continued due to its effectiveness and the low dose being well-tolerated. - Continue omeprazole 20 mg daily. - Avoid known food triggers (tuna and bananas). - will allergy test at follow-up  Environmental allergies (allergic rhinitis), intermittent Intermittent allergic rhinitis with sinus infections and postnasal drip, previously managed with Zyrtec. Symptoms have improved since discontinuing flu shots. Recent cessation of Benadryl  has reduced itching, suggesting possible adverse reaction to Benadryl . Allergy testing is planned to identify specific triggers, including seasonal allergies and food triggers like banana and tuna. - Schedule allergy testing for seasonal allergies and specific food triggers (banana and tuna). - Advise discontinuation of antihistamines three days prior to allergy testing. - Avoid Benadryl  due to potential adverse effects.  Follow up : next Monday at 9:30 AM (1-68, meats, tuna and banana). Must be off antihistamine for 3 days prior to visit.  It was a pleasure meeting you in clinic today! Thank you for allowing me to participate in your care.  Rocky Endow, MD [facility]

## 2024-09-04 NOTE — Progress Notes (Signed)
 NEW PATIENT Date of Service/Encounter:   09/04/2024 Referring provider: Jackolyn Darice BROCKS, FNP Primary care provider: Pcp, No  Subjective:  Charlotte Murray is a 43 y.o. female with a PMHx of EoE presenting today for evaluation of rash. History obtained from: chart review and patient.   Discussed the use of AI scribe software for clinical note transcription with the patient, who gave verbal consent to proceed.  History of Present Illness Charlotte Murray is a 43 year old female with eczema and eosinophilic esophagitis who presents with severe itching and rash.  Pruritus and cutaneous eruptions - Severe itching began on the sides of the legs in October or November of last year after initiating a walking regimen for weight loss - Initial absence of rash; attributed to possible stretch marks - By June, itching became uncontrollable and spread to the arms - Dermatologist identified a patch on the right side; prescribed hydrocortisone cream twice daily for two weeks with a one-week break, without improvement - Advised to take two Claritin in the morning and a half dose of Benadryl  at night; Benadryl  caused excessive drowsiness and did not improve symptoms - Pepcid  and a Kenalog  injection provided relief for three days before symptoms recurred - Hemorrhoid cream with lidocaine  was ineffective, prescribed by PCP - Currently experiences severe pruritus, waking four to five times nightly due to scratching - Bruising present from scratching - Discontinued Benadryl  one week ago, with some reduction in itching No prior history of eczema  Atopic and allergic history - History of eczema - History of allergies exacerbated by exposure to horses and hay during childhood - Previously took Zyrtec daily for postnasal drip and sinus infections; discontinued two years ago with subsequent improvement in symptoms - No current use of Zyrtec  Eosinophilic esophagitis and dysphagia - Diagnosed with  eosinophilic esophagitis in 2016 following swallowing difficulties and hospitalization for dehydration - Underwent upper esophageal procedure in 2017 and one esophageal stretch - Currently on omeprazole, initially 40 mg, now 20 mg daily - Avoids trigger foods such as tuna and bananas, which cause esophageal constriction and require self-induced emesis to clear  Adverse reactions to medications - Benadryl  caused excessive drowsiness and did not improve pruritus - Family history of adverse reaction to Benadryl  (father develops rashes)  Nocturnal symptoms and sleep disruption - Wakes four to five times nightly due to severe pruritus and scratching - Bruising from nocturnal scratching     Chart Review:  Reviewed PCP notes from referral 08/23/24: dermatitis 01/10/24: seen by GI for EoE, well controlled on prilosec 20 mg daily 08/01/24: dermatitis; given kenalog  IM, famotodine 40 mg at bedtime, HCT 2.5%, hemorrhoidal ointment with lidocaine  in itchy areas  2017 EGD: NORMAL DUODENAL BULB AND SECOND PORTION OF DUODENUM.   NORMAL STOMACH.  WHITE SPECKED MUCOSA IN THE ESOPHAGUS.  DILATION  PERFORMED IN THE LOWER THIRD OF THE ESOPHAGUS.  SUCCESSFUL TO 48 FR. R/O  EOSINOPHILIC ESOPHAGITIS.   Other allergy screening: Asthma: no Food allergy: no Medication allergy: no Hymenoptera allergy: no  Past Medical History: Past Medical History:  Diagnosis Date   Eczema    Urticaria    Medication List:  Current Outpatient Medications  Medication Sig Dispense Refill   cetirizine (ZYRTEC) 10 MG tablet Take 10 mg by mouth every evening.      ibuprofen  (ADVIL ,MOTRIN ) 600 MG tablet Take 1 tablet (600 mg total) by mouth every 6 (six) hours as needed for moderate pain or cramping. 60 tablet 0   Multiple Vitamin tablet Take  1 tablet by mouth daily.     omeprazole (PRILOSEC) 40 MG capsule Take 40 mg by mouth daily.     famotidine -calcium carbonate-magnesium hydroxide (PEPCID  COMPLETE) 10-800-165 MG CHEW  chewable tablet Chew 1 tablet by mouth 2 (two) times daily.  (Patient not taking: Reported on 09/04/2024)     oxyCODONE -acetaminophen  (PERCOCET/ROXICET) 5-325 MG tablet Take 1 tablet by mouth every 6 (six) hours as needed for severe pain. (Patient not taking: Reported on 09/04/2024) 20 tablet 0   Prenatal Vit-Fe Fumarate-FA (PRENATAL MULTIVITAMIN) TABS tablet Take 1 tablet by mouth every evening.  (Patient not taking: Reported on 09/04/2024)     No current facility-administered medications for this visit.   Known Allergies:  No Known Allergies Past Surgical History: Past Surgical History:  Procedure Laterality Date   TYMPANOSTOMY TUBE PLACEMENT     WISDOM TOOTH EXTRACTION     Family History: Family History  Problem Relation Age of Onset   Eczema Mother    Urticaria Father    Asthma Father    Allergic rhinitis Father    Asthma Sister    Social History: Charlotte Murray lives in a house built 16 years ago, no wtaer damage, carpet in bedroom, Architectural technologist, central AC indoor dog, outdoor goats, no roaches, not exposed to smoke, works as Librarian, academic, no HEPA filter in home[place]   ROS:  All other systems negative except as noted per HPI.  Objective:  Blood pressure 110/60, pulse 71, temperature 98.1 F (36.7 C), resp. rate 16, height 5' 2.5 (1.588 m), weight 209 lb 12.8 oz (95.2 kg), SpO2 97%, unknown if currently breastfeeding. Body mass index is 37.76 kg/m. Physical Exam:  General Appearance:  Alert, cooperative, no distress, appears stated age  Head:  Normocephalic, without obvious abnormality, atraumatic  Eyes:  Conjunctiva clear, EOM's intact  Ears EACs normal bilaterally and normal TMs bilaterally  Nose: Nares normal, hypertrophic turbinates, normal mucosa, and no visible anterior polyps  Throat: Lips, tongue normal; teeth and gums normal, normal posterior oropharynx  Neck: Supple, symmetrical  Lungs:   clear to auscultation bilaterally,  Respirations unlabored, no coughing  Heart:  regular rate and rhythm and no murmur, Appears well perfused  Extremities: No edema  Skin: erythematous, dry patches scattered on bilateral upper arms, anterior neck, upper thighs with bruising, lichenification and dry patches on bilateral popliteal fossa  Neurologic: No gross deficits   Diagnostics:   Labs:  Lab Orders  No laboratory test(s) ordered today     Assessment and Plan  Assessment and Plan Assessment & Plan Atopic dermatitis (eczema) with pruritus and lichenification Chronic atopic dermatitis with severe pruritus and lichenification, primarily affecting the legs, arms, and behind the knees. 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 Previous treatments with hydrocortisone and Kenalog  injection provided temporary relief. 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 Dupixent is considered for its efficacy in both conditions, with plans to administer it every other week given the controlled state of EOE. Daily Care For Maintenance (daily and continue even once eczema controlled) - Use hypoallergenic hydrating ointment at least twice daily.  This must be done daily for control of flares. (Great options include Vaseline, CeraVe, Aquaphor, Aveeno, Cetaphil, VaniCream, etc) - Avoid detergents, soaps or lotions with fragrances/dyes - Limit showers/baths to 5 minutes and use luke warm water instead of hot, pat dry following baths, and apply moisturizer - can use steroid/non-steroid therapy creams as detailed below up to  twice weekly for prevention of flares.  For Flares:(add this to maintenance therapy if needed for flares) First apply steroid/non-steroid treatment creams. Wait 5 minutes then apply moisturizer.  - Triamcinolone  0.1% to body for moderate flares-apply topically twice daily to red, raised areas of skin, followed by moisturizer. Do NOT use on face,  groin or armpits. - Hydrocortisone 2.5% to face/body-apply topically twice daily to red, raised areas of skin, followed by moisturizer - okay to use zyrtec 10 mg daily as needed for itching - return for environmental allergy testing -consider dupixent injections if not controlled on above  Eosinophilic esophagitis, controlled on omeprazole Eosinophilic esophagitis diagnosed in 2017, currently well-controlled on 20 mg omeprazole daily. Symptoms include dysphagia with specific triggers (tuna and bananas), which are avoided. No recent exacerbations or need for esophageal dilation since starting omeprazole. Dupixent is considered for its potential to treat both EOE and eczema, but omeprazole will be continued due to its effectiveness and the low dose being well-tolerated. - Continue omeprazole 20 mg daily. - Avoid known food triggers (tuna and bananas). - will allergy test at follow-up  Environmental allergies (allergic rhinitis), intermittent Intermittent allergic rhinitis with sinus infections and postnasal drip, previously managed with Zyrtec. Symptoms have improved since discontinuing flu shots. Recent cessation of Benadryl  has reduced itching, suggesting possible adverse reaction to Benadryl . Allergy testing is planned to identify specific triggers, including seasonal allergies and food triggers like banana and tuna. - Schedule allergy testing for seasonal allergies and specific food triggers (banana and tuna). - Advise discontinuation of antihistamines three days prior to allergy testing. - Avoid Benadryl  due to potential adverse effects.  Follow up : next Monday at 9:30 AM (1-68, meats, tuna and banana) 4 weeks for eczema It was a pleasure meeting you in clinic today! Thank you for allowing me to participate in your care.  Rocky Endow, MD [facility]     This note in its entirety was forwarded to the Provider who requested this consultation.  Other: samples  provided of vanicream  Thank you for your kind referral. I appreciate the opportunity to take part in Charlotte Murray's care. Please do not hesitate to contact me with questions.  Sincerely,  Rocky Endow, MD [facility] 

## 2024-09-07 ENCOUNTER — Telehealth: Payer: Self-pay

## 2024-09-07 NOTE — Telephone Encounter (Signed)
*  AA  Pharmacy Patient Advocate Encounter   Received notification from Fax that prior authorization for Tacrolimus  0.1% is required/requested.   Insurance verification completed.   The patient is insured through [facility] .   Per test claim: PA required; PA submitted to above mentioned insurance via Latent Key/confirmation #/EOC [id number] Status is pending

## 2024-09-07 NOTE — Telephone Encounter (Signed)
 Approved today by [facility] NCPDP 2017 Your PA request has been approved. Additional information will be provided in the approval communication. (Message 1145) Effective Date: 09/07/2024 Authorization Expiration Date: 12/07/2024

## 2024-09-11 ENCOUNTER — Encounter: Payer: Self-pay | Admitting: Internal Medicine

## 2024-09-11 ENCOUNTER — Ambulatory Visit: Admitting: Internal Medicine

## 2024-09-11 DIAGNOSIS — J3089 Other allergic rhinitis: Secondary | ICD-10-CM

## 2024-09-11 DIAGNOSIS — K200 Eosinophilic esophagitis: Secondary | ICD-10-CM

## 2024-09-11 DIAGNOSIS — J302 Other seasonal allergic rhinitis: Secondary | ICD-10-CM

## 2024-09-11 NOTE — Progress Notes (Signed)
 Date of Service/Encounter:  09/11/24  Allergy testing appointment   Initial visit on 09/04/24, seen for atopic dermatitis, eosinophilic esophagitis, chronic rhinitis.  Please see that note for additional details.  Today reports for allergy diagnostic testing:    DIAGNOSTICS:  Skin Testing: Environmental allergy panel. Adequate positive and negative controls. Results discussed with patient/family.  Airborne Adult Perc - 09/11/24 0958     Time Antigen Placed 9040    Allergen Manufacturer Jestine    Location Back    Number of Test 55    1. Control-Buffer 50% Glycerol Negative    2. Control-Histamine 2+    3. [place] 3+    4. [place] 4+    5. Johnson 4+    6. [place]  Blue 3+    7. Meadow Fescue 4+    8. Perennial Rye 3+    9. Timothy 3+    10. Ragweed Mix 3+    11. Cocklebur Negative    12. Plantain,  English 2+    13. Baccharis 3+    14. Dog Fennel 3+    15. Russian Thistle Negative    16. Lamb's Quarters Negative    17. Sheep Sorrell 3+    18. Rough Pigweed 2+    19. Marsh Elder, Rough 3+    20. Mugwort, Common 4+    21. Box, Elder 3+    22. Cedar, red Negative    23. Sweet Gum 3+    24. Pecan Pollen 3+    25. Pine Mix 2+    26. Walnut, Black Pollen 3+    27. Red Mulberry 4+    28. Ash Mix 3+    29. Birch Mix 3+    30. Beech American Negative    31. Cottonwood, Eastern 4+    32. Hickory, White 3+    33. Maple Mix 2+    34. Oak, [place] Mix 3+    35. Sycamore Eastern Negative    36. Alternaria Alternata Negative    37. Cladosporium Herbarum Negative    38. Aspergillus Mix Negative    39. Penicillium Mix Negative    40. Bipolaris Sorokiniana (Helminthosporium) Negative    41. Drechslera Spicifera (Curvularia) Negative    42. Mucor Plumbeus Negative    43. Fusarium Moniliforme 2+    44. Aureobasidium Pullulans (pullulara) Negative    45. Rhizopus Oryzae Negative    46. Botrytis Cinera Negative    47. Epicoccum Nigrum 2+    48. Phoma Betae Negative    49.  Dust Mite Mix Negative    50. Cat Hair 10,000 BAU/ml Negative    51.  Dog Epithelia Negative    52. Mixed Feathers Negative    53. Horse Epithelia Negative    54. Cockroach, German Negative    55. Tobacco Leaf Negative          Intradermal - 09/11/24 1035     Time Antigen Placed 1034    Allergen Manufacturer Jestine    Location Arm    Number of Test 8    Control Negative    Mold 1 3+    Mold 2 3+    Mold 3 3+    Mite Mix 3+    Cat 3+    Dog 3+    Cockroach Negative          Food Adult Perc - 09/11/24 0900     Time Antigen Placed 9040    Allergen Manufacturer Jestine    Number of allergen test  19    1. Peanut 3+    2. Soybean 2+    3. Wheat Negative    4. Sesame Negative    5. Milk, Cow 3+    6. Casein 2+    7. Egg White, Chicken Negative    8. Shellfish Mix 2+    9. Fish Mix Negative    10. Cashew Negative    11. Walnut Food Negative    12. Almond 2+    13. Hazelnut Negative    19. Tuna Negative    33. [place] Meat 2+    34. Chicken Meat 2+    35. Pork 2+    36. Beef 2+    57. Banana Negative          Allergy testing results were read and interpreted by myself, documented by clinical staff.  Patient provided with copy of allergy testing along with avoidance measures when indicated.   Rocky Endow, MD  [facility] 

## 2024-09-11 NOTE — Patient Instructions (Addendum)
 Atopic dermatitis (eczema) with pruritus and lichenification Chronic atopic dermatitis with severe pruritus and lichenification, primarily affecting the legs, arms, and behind the knees. 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 Previous treatments with hydrocortisone and Kenalog  injection provided temporary relief. 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 Dupixent is considered for its efficacy in both conditions, with plans to administer it every other week given the controlled state of EOE. Daily Care For Maintenance (daily and continue even once eczema controlled) - Use hypoallergenic hydrating ointment at least twice daily.  This must be done daily for control of flares. (Great options include Vaseline, CeraVe, Aquaphor, Aveeno, Cetaphil, VaniCream, etc) - Avoid detergents, soaps or lotions with fragrances/dyes - Limit showers/baths to 5 minutes and use luke warm water instead of hot, pat dry following baths, and apply moisturizer - can use steroid/non-steroid therapy creams as detailed below up to twice weekly for prevention of flares.  For Flares:(add this to maintenance therapy if needed for flares) First apply steroid/non-steroid treatment creams. Wait 5 minutes then apply moisturizer.  - Triamcinolone  0.1% to body for moderate flares-apply topically twice daily to red, raised areas of skin, followed by moisturizer. Do NOT use on face, groin or armpits. - Hydrocortisone 2.5% to face/body-apply topically twice daily to red, raised areas of skin, followed by moisturizer - add elidel or tacrolimus  one application twice daily as needed for flares - okay to use zyrtec 10 mg daily as needed for itching - return for environmental allergy testing -consider dupixent injections if not controlled on above  Eosinophilic esophagitis, controlled on omeprazole Eosinophilic esophagitis diagnosed in 2017, currently  well-controlled on 20 mg omeprazole daily. Symptoms include dysphagia with specific triggers (tuna and bananas), which are avoided. No recent exacerbations or need for esophageal dilation since starting omeprazole. Dupixent is considered for its potential to treat both EOE and eczema, but omeprazole will be continued due to its effectiveness and the low dose being well-tolerated. - Continue omeprazole 20 mg daily. - Avoid known food triggers (tuna and bananas). - skin testing for common food allergens meats, tuna and banana 09/11/24: positive to peanut, soy, milk, shellfish and almonds as well as beef, pork, chicken and [place].  As discussed with EoE, these are sensitivities (you eat them, nothing happens, but skin testing is positive). You do not need to strictly avoid these foods. If you have a flare of your EoE and dietary changes are considered, these foods should be considered at that time ONLY.  There are better therapy options than food avoidance, so this is not usually a first line therapy but can be considered if other options have failed.   Environmental allergies (allergic rhinitis), intermittent Intermittent allergic rhinitis with sinus infections and postnasal drip, previously managed with Zyrtec. Symptoms have improved since discontinuing flu shots. Recent cessation of Benadryl  has reduced itching, suggesting possible adverse reaction to Benadryl . - environmental skin testing 09/02/24: positive to grass, tree and weed pollen, minor molds; intradermal testing positive to indoor and outdoor molds, dust mites, cat, dog. Allergen avoidance -Consider allergy injections to reduce lifetime symptoms and need for medications by teaching your immune system to become tolerant of the environmental allergens you are allergic to - continue zyrtec 10 mg daily as needed - Avoid Benadryl  due to potential adverse effects.  Follow up : 3 weeks to discuss eczema control, sooner if needed. Will consider  dupixent sample if symptoms not improved. It was a pleasure meeting you in  clinic today!  DUST MITE AVOIDANCE MEASURES:  There are three main measures that need and can be taken to avoid house dust mites:  Reduce accumulation of dust in general -reduce furniture, clothing, carpeting, books, stuffed animals, especially in bedroom  Separate yourself from the dust -use pillow and mattress encasements (can be found at stores such as [employer] or online) -avoid direct exposure to air condition flow -use a HEPA filter device, especially in the bedroom; you can also use a HEPA filter vacuum cleaner -wipe dust with a moist towel instead of a dry towel or broom when cleaning  Decrease mites and/or their secretions -wash clothing and linen and stuffed animals at highest temperature possible, at least every 2 weeks -stuffed animals can also be placed in a bag and put in a freezer overnight  Despite the above measures, it is impossible to eliminate dust mites or their allergen completely from your home.  With the above measures the burden of mites in your home can be diminished, with the goal of minimizing your allergic symptoms.  Success will be reached only when implementing and using all means together. Reducing Pollen Exposure  The American Academy of Allergy, Asthma and Immunology suggests the following steps to reduce your exposure to pollen during allergy seasons.    Do not hang sheets or clothing out to dry; pollen may collect on these items. Do not mow lawns or spend time around freshly cut grass; mowing stirs up pollen. Keep windows closed at night.  Keep car windows closed while driving. Minimize morning activities outdoors, a time when pollen counts are usually at their highest. Stay indoors as much as possible when pollen counts or humidity is high and on windy days when pollen tends to remain in the air longer. Use air conditioning when possible.  Many air conditioners  have filters that trap the pollen spores. Use a HEPA room air filter to remove pollen form the indoor air you breathe. Control of Mold Allergen   Mold and fungi can grow on a variety of surfaces provided certain temperature and moisture conditions exist.  Outdoor molds grow on plants, decaying vegetation and soil.  The major outdoor mold, Alternaria and Cladosporium, are found in very high numbers during hot and dry conditions.  Generally, a late Summer - Fall peak is seen for common outdoor fungal spores.  Rain will temporarily lower outdoor mold spore count, but counts rise rapidly when the rainy period ends.  The most important indoor molds are Aspergillus and Penicillium.  Dark, humid and poorly ventilated basements are ideal sites for mold growth.  The next most common sites of mold growth are the bathroom and the kitchen.  Outdoor (Seasonal) Mold Control  Use air conditioning and keep windows closed Avoid exposure to decaying vegetation. Avoid leaf raking. Avoid grain handling. Consider wearing a face mask if working in moldy areas.    Indoor (Perennial) Mold Control   Maintain humidity below 50%. Clean washable surfaces with 5% bleach solution. Remove sources e.g. contaminated carpets.   Control of Dog or Cat Allergen  Avoidance is the best way to manage a dog or cat allergy. If you have a dog or cat and are allergic to dog or cats, consider removing the dog or cat from the home. If you have a dog or cat but don't want to find it a new home, or if your family wants a pet even though someone in the household is allergic, here are some strategies  that may help keep symptoms at bay:  Keep the pet out of your bedroom and restrict it to only a few rooms. Be advised that keeping the dog or cat in only one room will not limit the allergens to that room. Don't pet, hug or kiss the dog or cat; if you do, wash your hands with soap and water. High-efficiency particulate air (HEPA) cleaners  run continuously in a bedroom or living room can reduce allergen levels over time. Regular use of a high-efficiency vacuum cleaner or a central vacuum can reduce allergen levels. Giving your dog or cat a bath at least once a week can reduce airborne allergen.   Thank you for allowing me to participate in your care.  Rocky Endow, MD [facility]

## 2024-10-02 ENCOUNTER — Other Ambulatory Visit: Payer: Self-pay

## 2024-10-02 ENCOUNTER — Encounter: Payer: Self-pay | Admitting: Internal Medicine

## 2024-10-02 ENCOUNTER — Ambulatory Visit: Admitting: Internal Medicine

## 2024-10-02 VITALS — BP 126/84 | HR 95 | Temp 98.1°F | Ht 63.0 in | Wt 213.1 lb

## 2024-10-02 DIAGNOSIS — L308 Other specified dermatitis: Secondary | ICD-10-CM | POA: Diagnosis not present

## 2024-10-02 DIAGNOSIS — J3089 Other allergic rhinitis: Secondary | ICD-10-CM

## 2024-10-02 DIAGNOSIS — K200 Eosinophilic esophagitis: Secondary | ICD-10-CM | POA: Diagnosis not present

## 2024-10-02 DIAGNOSIS — J302 Other seasonal allergic rhinitis: Secondary | ICD-10-CM

## 2024-10-02 MED ORDER — DUPILUMAB 300 MG/2ML ~~LOC~~ SOSY
300.0000 mg | PREFILLED_SYRINGE | Freq: Once | SUBCUTANEOUS | Status: AC
  Administered 2024-10-02: 300 mg via SUBCUTANEOUS

## 2024-10-02 MED ORDER — TACROLIMUS 0.1 % EX OINT: TOPICAL_OINTMENT | Freq: Two times a day (BID) | CUTANEOUS | 3 refills | Status: AC | PRN

## 2024-10-02 MED ORDER — ZORYVE 0.15 % EX CREA: 1.0000 | TOPICAL_CREAM | Freq: Every day | CUTANEOUS | 3 refills | Status: DC | PRN

## 2024-10-02 MED ORDER — DUPILUMAB 300 MG/2ML ~~LOC~~ SOSY: 300.0000 mg | PREFILLED_SYRINGE | Freq: Once | SUBCUTANEOUS | Status: DC

## 2024-10-02 NOTE — Patient Instructions (Addendum)
 Atopic dermatitis (eczema) with pruritus and lichenification Chronic atopic dermatitis with severe pruritus and lichenification, primarily affecting the legs, arms, and behind the knees. 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 Previous treatments with hydrocortisone and Kenalog  injection provided temporary relief. 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 Dupixent is considered for its efficacy in both conditions, with plans to administer it every other week given the controlled state of EOE. Daily Care For Maintenance (daily and continue even once eczema controlled) - Use hypoallergenic hydrating ointment at least twice daily.  This must be done daily for control of flares. (Great options include Vaseline, CeraVe, Aquaphor, Aveeno, Cetaphil, VaniCream, etc) - Avoid detergents, soaps or lotions with fragrances/dyes - Limit showers/baths to 5 minutes and use luke warm water instead of hot, pat dry following baths, and apply moisturizer - can use steroid/non-steroid therapy creams as detailed below up to twice weekly for prevention of flares.  For Flares:(add this to maintenance therapy if needed for flares) First apply steroid/non-steroid treatment creams. Wait 5 minutes then apply moisturizer.  - Triamcinolone  0.1% to body for moderate flares-apply topically twice daily to red, raised areas of skin, followed by moisturizer. Do NOT use on face, groin or armpits. - Hydrocortisone 2.5% to face/body-apply topically twice daily to red, raised areas of skin, followed by moisturizer - add tacrolimus  one application twice daily as needed for flares or zoryve once daily (both tacrolimus  and zoryve sent) - whichever is approved - okay to use zyrtec 10 mg daily up to twice daily as needed for itching -dupixent 600 mg sample provided in clinic today We will submit paperwork for Dupixent. You will hear from our biologics  coordinator Tammy VonCannon. Please answer her phone calls to ensure a seamless approval process.   Eosinophilic esophagitis, controlled on omeprazole Eosinophilic esophagitis diagnosed in 2017, currently well-controlled on 20 mg omeprazole daily. Symptoms include dysphagia with specific triggers (tuna and bananas), which are avoided. No recent exacerbations or need for esophageal dilation since starting omeprazole. Dupixent is considered for its potential to treat both EOE and eczema, but omeprazole will be continued due to its effectiveness and the low dose being well-tolerated. - Continue omeprazole 20 mg daily. - Avoid known food triggers (tuna and bananas). - skin testing for common food allergens meats, tuna and banana 09/11/24: positive to peanut, soy, milk, shellfish and almonds as well as beef, pork, chicken and [place].  As discussed with EoE, these are sensitivities (you eat them, nothing happens, but skin testing is positive). You do not need to strictly avoid these foods. If you have a flare of your EoE and dietary changes are considered, these foods should be considered at that time ONLY.  There are better therapy options than food avoidance, so this is not usually a first line therapy but can be considered if other options have failed.   Environmental allergies (allergic rhinitis), intermittent Intermittent allergic rhinitis with sinus infections and postnasal drip, previously managed with Zyrtec. Symptoms have improved since discontinuing flu shots. Recent cessation of Benadryl  has reduced itching, suggesting possible adverse reaction to Benadryl . - environmental skin testing 09/02/24: positive to grass, tree and weed pollen, minor molds; intradermal testing positive to indoor and outdoor molds, dust mites, cat, dog. Allergen avoidance -Consider allergy injections to reduce lifetime symptoms and need for medications by teaching your immune system to become tolerant of the environmental  allergens you are allergic to - continue zyrtec 10 mg daily as needed -  Avoid Benadryl  due to potential adverse effects.  Follow up : 3 months, sooner if needed It was a pleasure seeing you again in clinic today! Thank you for allowing me to participate in your care.  Rocky Endow, MD [facility]  It was a pleasure meeting you in clinic today!  DUST MITE AVOIDANCE MEASURES:  There are three main measures that need and can be taken to avoid house dust mites:  Reduce accumulation of dust in general -reduce furniture, clothing, carpeting, books, stuffed animals, especially in bedroom  Separate yourself from the dust -use pillow and mattress encasements (can be found at stores such as [employer] or online) -avoid direct exposure to air condition flow -use a HEPA filter device, especially in the bedroom; you can also use a HEPA filter vacuum cleaner -wipe dust with a moist towel instead of a dry towel or broom when cleaning  Decrease mites and/or their secretions -wash clothing and linen and stuffed animals at highest temperature possible, at least every 2 weeks -stuffed animals can also be placed in a bag and put in a freezer overnight  Despite the above measures, it is impossible to eliminate dust mites or their allergen completely from your home.  With the above measures the burden of mites in your home can be diminished, with the goal of minimizing your allergic symptoms.  Success will be reached only when implementing and using all means together. Reducing Pollen Exposure  The American Academy of Allergy, Asthma and Immunology suggests the following steps to reduce your exposure to pollen during allergy seasons.    Do not hang sheets or clothing out to dry; pollen may collect on these items. Do not mow lawns or spend time around freshly cut grass; mowing stirs up pollen. Keep windows closed at night.  Keep car windows closed while driving. Minimize  morning activities outdoors, a time when pollen counts are usually at their highest. Stay indoors as much as possible when pollen counts or humidity is high and on windy days when pollen tends to remain in the air longer. Use air conditioning when possible.  Many air conditioners have filters that trap the pollen spores. Use a HEPA room air filter to remove pollen form the indoor air you breathe. Control of Mold Allergen   Mold and fungi can grow on a variety of surfaces provided certain temperature and moisture conditions exist.  Outdoor molds grow on plants, decaying vegetation and soil.  The major outdoor mold, Alternaria and Cladosporium, are found in very high numbers during hot and dry conditions.  Generally, a late Summer - Fall peak is seen for common outdoor fungal spores.  Rain will temporarily lower outdoor mold spore count, but counts rise rapidly when the rainy period ends.  The most important indoor molds are Aspergillus and Penicillium.  Dark, humid and poorly ventilated basements are ideal sites for mold growth.  The next most common sites of mold growth are the bathroom and the kitchen.  Outdoor (Seasonal) Mold Control  Use air conditioning and keep windows closed Avoid exposure to decaying vegetation. Avoid leaf raking. Avoid grain handling. Consider wearing a face mask if working in moldy areas.    Indoor (Perennial) Mold Control   Maintain humidity below 50%. Clean washable surfaces with 5% bleach solution. Remove sources e.g. contaminated carpets.   Control of Dog or Cat Allergen  Avoidance is the best way to manage a dog or cat allergy. If you have a  dog or cat and are allergic to dog or cats, consider removing the dog or cat from the home. If you have a dog or cat but don't want to find it a new home, or if your family wants a pet even though someone in the household is allergic, here are some strategies that may help keep symptoms at bay:  Keep the pet out of your  bedroom and restrict it to only a few rooms. Be advised that keeping the dog or cat in only one room will not limit the allergens to that room. Don't pet, hug or kiss the dog or cat; if you do, wash your hands with soap and water. High-efficiency particulate air (HEPA) cleaners run continuously in a bedroom or living room can reduce allergen levels over time. Regular use of a high-efficiency vacuum cleaner or a central vacuum can reduce allergen levels. Giving your dog or cat a bath at least once a week can reduce airborne allergen.   Thank you for allowing me to participate in your care.  Rocky Endow, MD [facility] 

## 2024-10-02 NOTE — Progress Notes (Signed)
 FOLLOW UP Date of Service/Encounter:   10/02/2024  Subjective:  Charlotte Murray (DOB: 1981-05-14) is a 43 y.o. female who [facility] in re-evaluation of the following: atopic dermatitis, allergic rhinitis, EoE History obtained from: chart review and patient.  For Review, LV was on 09/11/24  with Dr.Gayna Braddy seen for allergy testing. See below for summary of history and diagnostics.  ----------------------------------------------------- Pertinent History/Diagnostics:  Atopic dermatitis (eczema) with pruritus and lichenification Chronic atopic dermatitis with severe pruritus and lichenification, primarily affecting the legs, arms, and behind the knees. 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 Previous treatments with hydrocortisone and Kenalog  injection provided temporary relief. 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 Dupixent is considered for its efficacy in both conditions, with plans to administer it every other week given the controlled state of EOE. Eosinophilic esophagitis, controlled on omeprazole Eosinophilic esophagitis diagnosed in 2017, currently well-controlled on 20 mg omeprazole daily. Symptoms include dysphagia with specific triggers (tuna and bananas), which are avoided. No recent exacerbations or need for esophageal dilation since starting omeprazole. - skin testing for common food allergens meats, tuna and banana 09/11/24: positive to peanut, soy, milk, shellfish and almonds as well as beef, pork, chicken and [place]. All sensitivities-discussed considering elimination diet if EoE uncontrolled (last resort) Environmental allergies (allergic rhinitis), intermittent Intermittent allergic rhinitis with sinus infections and postnasal drip, previously managed with Zyrtec. Symptoms have improved since discontinuing flu shots. Recent cessation of  Benadryl  has reduced itching, suggesting possible adverse reaction to Benadryl . - environmental skin testing 09/02/24: positive to grass, tree and weed pollen, minor molds; intradermal testing positive to indoor and outdoor molds, dust mites, cat, dog. --------------------------------------------------- Today presents for follow-up. Discussed the use of AI scribe software for clinical note transcription with the patient, who gave verbal consent to proceed.  History of Present Illness Charlotte Murray is a 43 year old female with eczema who presents with persistent itching.  Pruritus and eczematous dermatitis - Severe, persistent pruritus primarily affecting the stomach, arms, and back - Less severe pruritus on the legs - Itching is constant and localized to the aforementioned areas - Minimal relief with current treatments, including Zyrtec, hydrocortisone, and triamcinolone  ointments, tacrolimus  prescribed as well (PA pending) - we discussed starting dupixent and she would like to move forward with this.   Allergic and eosinophilic disorders - No current allergy symptoms - Eosinophilic esophagitis is well-controlled with omeprazole   All medications reviewed by clinical staff and updated in chart. No new pertinent medical or surgical history except as noted in HPI.  ROS: All others negative except as noted per HPI.   Objective:  BP 126/84 (BP Location: Left Arm, Patient Position: Sitting)   Pulse 95   Temp 98.1 F (36.7 C) (Temporal)   Ht 5' 3 (1.6 m)   Wt 213 lb 1.6 oz (96.7 kg)   SpO2 100%   BMI 37.75 kg/m  Body mass index is 37.75 kg/m. Physical Exam: General Appearance:  Alert, cooperative, no distress, appears stated age  Head:  Normocephalic, without obvious abnormality, atraumatic  Eyes:  Conjunctiva clear, EOM's intact  Nose: Nares normal, no rhinorrhea  Throat: Lips, tongue normal; teeth and gums normal, moist mucus membranes  Neck: Supple, symmetrical   Lungs:   clear to auscultation bilaterally, Respirations unlabored, no coughing  Heart:  regular rate and rhythm and no murmur, Appears well perfused  Extremities: No edema  Skin: erythematous, dry  patches scattered on upper back bilaterally, abdomen  Neurologic: No gross deficits   Labs:  Lab Orders  No laboratory test(s) ordered today    Assessment/Plan   Atopic dermatitis (eczema) with pruritus and lichenification-not at goal Chronic atopic dermatitis with severe pruritus and lichenification, primarily affecting the legs, arms, and behind the knees. 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 Previous treatments with hydrocortisone and Kenalog  injection provided temporary relief. 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 Dupixent is considered for its efficacy in both conditions, with plans to administer it every other week given the controlled state of EOE. Daily Care For Maintenance (daily and continue even once eczema controlled) - Use hypoallergenic hydrating ointment at least twice daily.  This must be done daily for control of flares. (Great options include Vaseline, CeraVe, Aquaphor, Aveeno, Cetaphil, VaniCream, etc) - Avoid detergents, soaps or lotions with fragrances/dyes - Limit showers/baths to 5 minutes and use luke warm water instead of hot, pat dry following baths, and apply moisturizer - can use steroid/non-steroid therapy creams as detailed below up to twice weekly for prevention of flares.  For Flares:(add this to maintenance therapy if needed for flares) First apply steroid/non-steroid treatment creams. Wait 5 minutes then apply moisturizer.  - Triamcinolone  0.1% to body for moderate flares-apply topically twice daily to red, raised areas of skin, followed by moisturizer. Do NOT use on face, groin or armpits. - Hydrocortisone 2.5% to face/body-apply topically twice daily to red, raised  areas of skin, followed by moisturizer - add elidel or tacrolimus  one application twice daily as needed for flares - okay to use zyrtec 10 mg daily up to twice daily as needed for itching -dupixent 600 mg sample provided in clinic today We will submit paperwork for Dupixent. You will hear from our biologics coordinator Tammy VonCannon. Please answer her phone calls to ensure a seamless approval process.   Eosinophilic esophagitis, controlled on omeprazole Eosinophilic esophagitis diagnosed in 2017, currently well-controlled on 20 mg omeprazole daily. Symptoms include dysphagia with specific triggers (tuna and bananas), which are avoided. No recent exacerbations or need for esophageal dilation since starting omeprazole. Dupixent is considered for its potential to treat both EOE and eczema, but omeprazole will be continued due to its effectiveness and the low dose being well-tolerated. - Continue omeprazole 20 mg daily. - Avoid known food triggers (tuna and bananas). - skin testing for common food allergens meats, tuna and banana 09/11/24: positive to peanut, soy, milk, shellfish and almonds as well as beef, pork, chicken and [place].  As discussed with EoE, these are sensitivities (you eat them, nothing happens, but skin testing is positive). You do not need to strictly avoid these foods. If you have a flare of your EoE and dietary changes are considered, these foods should be considered at that time ONLY.  There are better therapy options than food avoidance, so this is not usually a first line therapy but can be considered if other options have failed.   Environmental allergies (allergic rhinitis), intermittent, at goal Intermittent allergic rhinitis with sinus infections and postnasal drip, previously managed with Zyrtec. Symptoms have improved since discontinuing flu shots. Recent cessation of Benadryl  has reduced itching, suggesting possible adverse reaction to Benadryl . - environmental skin  testing 09/02/24: positive to grass, tree and weed pollen, minor molds; intradermal testing positive to indoor and outdoor molds, dust mites, cat, dog. Allergen avoidance -Consider allergy injections to reduce lifetime symptoms and need for medications by  teaching your immune system to become tolerant of the environmental allergens you are allergic to - continue zyrtec 10 mg daily as needed - Avoid Benadryl  due to potential adverse effects.  Follow up : 3 months, sooner if needed It was a pleasure seeing you again in clinic today! Thank you for allowing me to participate in your care.  Rocky Endow, MD [facility]   It was a pleasure meeting you in clinic today!  Other: dupixent loading dose given 600 mg in clinic  Rocky Endow, MD  [facility] 

## 2024-10-04 ENCOUNTER — Other Ambulatory Visit (HOSPITAL_COMMUNITY): Payer: Self-pay

## 2024-10-04 ENCOUNTER — Telehealth: Payer: Self-pay

## 2024-10-04 NOTE — Telephone Encounter (Signed)
 Pharmacy Patient Advocate Encounter  Received notification from [facility] that Prior Authorization for Zoryve 0.15% cream  has been APPROVED from 10/04/2024 to 01/02/2025

## 2024-10-04 NOTE — Telephone Encounter (Signed)
*  AA  Pharmacy Patient Advocate Encounter   Received notification from Fax that prior authorization for Zoryve 0.15% cream  is required/requested.   Insurance verification completed.   The patient is insured through [facility].   Per test claim: PA required; PA submitted to above mentioned insurance via Latent Key/confirmation #/EOC [id number] Status is pending

## 2024-10-09 ENCOUNTER — Ambulatory Visit: Admitting: Internal Medicine

## 2024-10-10 ENCOUNTER — Telehealth: Payer: Self-pay | Admitting: *Deleted

## 2024-10-10 NOTE — Telephone Encounter (Signed)
-----   Message from Rocky LOISE Endow sent at 10/02/2024  5:20 PM EDT ----- Hi Kristell Wooding- can we start dupixent for eczema? I gave loading dose today.

## 2024-10-10 NOTE — Telephone Encounter (Signed)
 Called patient and advised approval, copay card and submit to [employer] for Dupixent. Patient instructed on delivery, storage and dosing for same. She wants to bring same into clinic for admin and will make appt once delivery made

## 2024-10-16 ENCOUNTER — Ambulatory Visit: Admitting: *Deleted

## 2024-10-16 DIAGNOSIS — L209 Atopic dermatitis, unspecified: Secondary | ICD-10-CM | POA: Diagnosis not present

## 2024-10-16 MED ORDER — DUPILUMAB 300 MG/2ML ~~LOC~~ SOSY
300.0000 mg | PREFILLED_SYRINGE | SUBCUTANEOUS | Status: AC
  Administered 2024-10-16 – 2025-01-01 (×6): 300 mg via SUBCUTANEOUS

## 2024-10-30 ENCOUNTER — Ambulatory Visit: Admitting: *Deleted

## 2024-10-30 DIAGNOSIS — L209 Atopic dermatitis, unspecified: Secondary | ICD-10-CM

## 2024-11-13 ENCOUNTER — Ambulatory Visit: Admitting: *Deleted

## 2024-11-13 DIAGNOSIS — L209 Atopic dermatitis, unspecified: Secondary | ICD-10-CM

## 2024-11-27 ENCOUNTER — Ambulatory Visit

## 2024-12-04 ENCOUNTER — Ambulatory Visit

## 2024-12-04 DIAGNOSIS — L209 Atopic dermatitis, unspecified: Secondary | ICD-10-CM | POA: Diagnosis not present

## 2024-12-11 ENCOUNTER — Telehealth: Payer: Self-pay

## 2024-12-11 NOTE — Telephone Encounter (Signed)
*  AA  Pharmacy Patient Advocate Encounter   Received notification from Fax that prior authorization for Zoryve  0.15% cream is required/requested.   Insurance verification completed.   The patient is insured through [facility].   Per test claim: PA required; PA submitted to above mentioned insurance via Latent Key/confirmation #/EOC [id number] Status is pending

## 2024-12-11 NOTE — Telephone Encounter (Signed)
 Documentation required of a positive clinical response of symptoms for continuation of medication if needed, please clarify.

## 2024-12-13 NOTE — Telephone Encounter (Signed)
 PA denied due to no documentation of of improvement with medication. Full denial letter attached in patients media.

## 2024-12-18 ENCOUNTER — Ambulatory Visit (INDEPENDENT_AMBULATORY_CARE_PROVIDER_SITE_OTHER)

## 2024-12-18 DIAGNOSIS — L209 Atopic dermatitis, unspecified: Secondary | ICD-10-CM

## 2024-12-18 NOTE — Telephone Encounter (Signed)
 Please have her schedule a follow-up to discuss response to Zoryve  if she would like to continue. Her insurance has denied further refills until it is shown that she responds.

## 2024-12-19 NOTE — Telephone Encounter (Signed)
 Patient is scheduled for 01/01/25 to follow up with you, is that okay or do we need to have her come in sooner?

## 2025-01-01 ENCOUNTER — Ambulatory Visit

## 2025-01-01 ENCOUNTER — Other Ambulatory Visit: Payer: Self-pay

## 2025-01-01 ENCOUNTER — Encounter: Payer: Self-pay | Admitting: Internal Medicine

## 2025-01-01 ENCOUNTER — Ambulatory Visit: Admitting: Internal Medicine

## 2025-01-01 VITALS — BP 128/82 | HR 80 | Temp 98.1°F | Ht 62.5 in | Wt 213.1 lb

## 2025-01-01 DIAGNOSIS — J302 Other seasonal allergic rhinitis: Secondary | ICD-10-CM | POA: Diagnosis not present

## 2025-01-01 DIAGNOSIS — K200 Eosinophilic esophagitis: Secondary | ICD-10-CM

## 2025-01-01 DIAGNOSIS — L209 Atopic dermatitis, unspecified: Secondary | ICD-10-CM | POA: Diagnosis not present

## 2025-01-01 DIAGNOSIS — L308 Other specified dermatitis: Secondary | ICD-10-CM

## 2025-01-01 DIAGNOSIS — J3089 Other allergic rhinitis: Secondary | ICD-10-CM

## 2025-01-01 MED ORDER — ZORYVE 0.15 % EX CREA: 1.0000 | TOPICAL_CREAM | Freq: Every day | CUTANEOUS | 3 refills | Status: AC | PRN

## 2025-01-01 MED ORDER — CLOBETASOL PROPIONATE 0.05 % EX OINT: TOPICAL_OINTMENT | CUTANEOUS | 1 refills | Status: AC

## 2025-01-01 NOTE — Progress Notes (Signed)
 "  FOLLOW UP Date of Service/Encounter:   01/01/2025  Subjective:  Charlotte Murray (DOB: [birth year]) is a 44 y.o. female who [facility] in re-evaluation of the following: atopic dermatitis, EoE, allergic rhinitis History obtained from: chart review and patient.  For Review, LV was on 10/02/24  with Dr.Jagjit Riner seen for routine follow-up. See below for summary of history and diagnostics.   Therapeutic plans/changes recommended: skin not controlled, we started dupixent  injections ----------------------------------------------------- Pertinent History/Diagnostics:  Atopic dermatitis (eczema) with pruritus and lichenification Chronic atopic dermatitis with severe pruritus and lichenification, primarily affecting the legs, arms, and behind the knees. 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 Previous treatments with hydrocortisone and Kenalog  injection provided temporary relief. 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 Dupixent  is considered for its efficacy in both conditions, with plans to administer it every other week given the controlled state of EOE. - dupixent  started 10/02/24 Eosinophilic esophagitis, controlled on omeprazole Eosinophilic esophagitis diagnosed in 2017, currently well-controlled on 20 mg omeprazole daily. Symptoms include dysphagia with specific triggers (tuna and bananas), which are avoided. No recent exacerbations or need for esophageal dilation since starting omeprazole. - skin testing for common food allergens meats, tuna and banana 09/11/24: positive to peanut, soy, milk, shellfish and almonds as well as beef, pork, chicken and turkey. All sensitivities-discussed considering elimination diet if EoE uncontrolled (last resort) Environmental allergies (allergic rhinitis), intermittent Intermittent allergic rhinitis with sinus infections and  postnasal drip, previously managed with Zyrtec. Symptoms have improved since discontinuing flu shots. Recent cessation of Benadryl  has reduced itching, suggesting possible adverse reaction to Benadryl . - environmental skin testing 09/02/24: positive to grass, tree and weed pollen, minor molds; intradermal testing positive to indoor and outdoor molds, dust mites, cat, dog. --------------------------------------------------- Today presents for follow-up. Discussed the use of AI scribe software for clinical note transcription with the patient, who gave verbal consent to proceed.  History of Present Illness Charlotte Murray is a 44 year old female with eczema who presents with concerns about her Dupixent  injections and skin reactions.  Eczematous dermatitis and injection site reactions - Recent flare-up of eczema symptoms following a one-week delay in Dupixent  injection due to a sinus infection and pharmacy scheduling confusion - Symptoms included 'red bumps' and significant pruritus at the injection site - Pruritus severe enough to cause bruising from scratching - Triamcinolone  ointment used for symptom management - Occasional use of Zyrtec in the evening for pruritus - Improvement in skin condition, with better sleep and reduced nocturnal scratching  Medication administration and adherence issues - Dupixent  injection delayed by one week due to unresolved sinus infection and confusion with specialty pharmacy - Resumed regular Dupixent  injection schedule of every two weeks after delay - Expresses frustration with frequent calls from the specialty pharmacy regarding medication schedule and delivery  Sinus infection - Recent sinus infection unresponsive to initial antibiotic therapy, resulting in delayed Dupixent  injection  Eosinophilic esophagitis symptoms - No change in eosinophilic esophagitis symptoms - Avoids foods that typically trigger symptoms  Has not required zyrtec for allergic  rhinitis often, but will occasionally take it for itching.  Feels significant improvement in skin rashes and itching since starting Dupixent . Had a small flare due to change in schedule, but now controlled.   She does have topical Zoryve  which she found effective, but it has been denied by her insurance.    All medications reviewed by clinical staff and updated  in chart. No new pertinent medical or surgical history except as noted in HPI.  ROS: All others negative except as noted per HPI.   Objective:  BP 128/82 (BP Location: Left Arm, Patient Position: Sitting, Cuff Size: Normal)   Pulse 80   Temp 98.1 F (36.7 C) (Temporal)   Ht 5' 2.5 (1.588 m)   Wt 213 lb 1.6 oz (96.7 kg)   SpO2 99%   BMI 38.36 kg/m  Body mass index is 38.36 kg/m. Physical Exam: General Appearance:  Alert, cooperative, no distress, appears stated age  Head:  Normocephalic, without obvious abnormality, atraumatic  Eyes:  Conjunctiva clear, EOM's intact  Ears EACs normal bilaterally and normal TMs bilaterally  Nose: Nares normal, hypertrophic turbinates, normal mucosa, and no visible anterior polyps  Throat: Lips, tongue normal; teeth and gums normal, normal posterior oropharynx  Neck: Supple, symmetrical  Lungs:   clear to auscultation bilaterally, Respirations unlabored, no coughing  Heart:  regular rate and rhythm and no murmur, Appears well perfused  Extremities: No edema  Skin: erythematous, dry patches scattered on right and left upper arms near injection sites  Neurologic: No gross deficits   Labs:  Lab Orders  No laboratory test(s) ordered today    Assessment/Plan   Atopic dermatitis (eczema) with pruritus and lichenification-significantly improved on dupixent  Initial Hx: Chronic atopic dermatitis with severe pruritus and lichenification, primarily affecting the legs, arms, and behind the knees. 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  Previous treatments with hydrocortisone and Kenalog  injection provided temporary relief. 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 Interval Hx: Significant improvement in symptoms since starting dupixent .  Daily Care For Maintenance (daily and continue even once eczema controlled) - Use hypoallergenic hydrating ointment at least twice daily.  This must be done daily for control of flares. (Great options include Vaseline, CeraVe, Aquaphor, Aveeno, Cetaphil, VaniCream, etc) - Avoid detergents, soaps or lotions with fragrances/dyes - Limit showers/baths to 5 minutes and use luke warm water instead of hot, pat dry following baths, and apply moisturizer - can use steroid/non-steroid therapy creams as detailed below up to twice weekly for prevention of flares.  For Flares:(add this to maintenance therapy if needed for flares) First apply steroid/non-steroid treatment creams. Wait 5 minutes then apply moisturizer.  - for severe flares, use clobetasol  0.05% twice daily as needed on site reactions from dupixent  if occur. - Triamcinolone  0.1% to body for moderate flares-apply topically twice daily to red, raised areas of skin, followed by moisturizer. Do NOT use on face, groin or armpits. - Hydrocortisone 2.5% to face/body-apply topically twice daily to red, raised areas of skin, followed by moisturizer - Tacrolimus  one application twice daily as needed for flares or zoryve  once daily (both tacrolimus  and zoryve  sent) - whichever is approved - okay to use zyrtec 10 mg daily up to twice daily as needed for itching - continue dupixent  300 mg every 2 weeks per protocol   Eosinophilic esophagitis, controlled on omeprazole Eosinophilic esophagitis diagnosed in 2017, currently well-controlled on 20 mg omeprazole daily. Symptoms include dysphagia with specific triggers (tuna and bananas), which are avoided. No recent exacerbations or need for esophageal dilation since starting  omeprazole.  - Continue omeprazole 20 mg daily. - Avoid known food triggers (tuna and bananas). - skin testing for common food allergens meats, tuna and banana 09/11/24: positive to peanut, soy, milk, shellfish and almonds as well as beef, pork, chicken and turkey.  As discussed with  EoE, these are sensitivities (you eat them, nothing happens, but skin testing is positive). You do not need to strictly avoid these foods. If you have a flare of your EoE and dietary changes are considered, these foods should be considered at that time ONLY.  There are better therapy options than food avoidance, so this is not usually a first line therapy but can be considered if other options have failed.   Environmental allergies (allergic rhinitis), intermittent-at goal Intermittent allergic rhinitis with sinus infections and postnasal drip, previously managed with Zyrtec.  - environmental skin testing 09/02/24: positive to grass, tree and weed pollen, minor molds; intradermal testing positive to indoor and outdoor molds, dust mites, cat, dog. Allergen avoidance -Consider allergy  injections to reduce lifetime symptoms and need for medications by teaching your immune system to become tolerant of the environmental allergens you are allergic to - continue zyrtec 10 mg daily as needed - Avoid Benadryl  due to potential adverse effects.  Follow up : 6 months, sooner if needed It was a pleasure seeing you again in clinic today! Thank you for allowing me to participate in your care.  Rocky Endow, MD [facility]  It was a pleasure meeting you in clinic today!  Other: dupixent  injection given in clinic  Rocky Endow, MD  [facility] [place]        "

## 2025-01-01 NOTE — Patient Instructions (Addendum)
 Atopic dermatitis (eczema) with pruritus and lichenification-significantly improved on dupixent  Initial Hx: Chronic atopic dermatitis with severe pruritus and lichenification, primarily affecting the legs, arms, and behind the knees. 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 Previous treatments with hydrocortisone and Kenalog  injection provided temporary relief. 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 Interval Hx: Significant improvement in symptoms since starting dupixent .  Daily Care For Maintenance (daily and continue even once eczema controlled) - Use hypoallergenic hydrating ointment at least twice daily.  This must be done daily for control of flares. (Great options include Vaseline, CeraVe, Aquaphor, Aveeno, Cetaphil, VaniCream, etc) - Avoid detergents, soaps or lotions with fragrances/dyes - Limit showers/baths to 5 minutes and use luke warm water instead of hot, pat dry following baths, and apply moisturizer - can use steroid/non-steroid therapy creams as detailed below up to twice weekly for prevention of flares.  For Flares:(add this to maintenance therapy if needed for flares) First apply steroid/non-steroid treatment creams. Wait 5 minutes then apply moisturizer.  - for severe flares, use clobetasol  0.05% twice daily as needed on site reactions from dupixent  if occur. - Triamcinolone  0.1% to body for moderate flares-apply topically twice daily to red, raised areas of skin, followed by moisturizer. Do NOT use on face, groin or armpits. - Hydrocortisone 2.5% to face/body-apply topically twice daily to red, raised areas of skin, followed by moisturizer - Tacrolimus  one application twice daily as needed for flares or zoryve  once daily (both tacrolimus  and zoryve  sent) - whichever is approved - okay to use zyrtec 10 mg daily up to twice daily as needed for itching - continue dupixent  300 mg every 2 weeks  per protocol   Eosinophilic esophagitis, controlled on omeprazole Eosinophilic esophagitis diagnosed in 2017, currently well-controlled on 20 mg omeprazole daily. Symptoms include dysphagia with specific triggers (tuna and bananas), which are avoided. No recent exacerbations or need for esophageal dilation since starting omeprazole.  - Continue omeprazole 20 mg daily. - Avoid known food triggers (tuna and bananas). - skin testing for common food allergens meats, tuna and banana 09/11/24: positive to peanut, soy, milk, shellfish and almonds as well as beef, pork, chicken and turkey.  As discussed with EoE, these are sensitivities (you eat them, nothing happens, but skin testing is positive). You do not need to strictly avoid these foods. If you have a flare of your EoE and dietary changes are considered, these foods should be considered at that time ONLY.  There are better therapy options than food avoidance, so this is not usually a first line therapy but can be considered if other options have failed.   Environmental allergies (allergic rhinitis), intermittent Intermittent allergic rhinitis with sinus infections and postnasal drip, previously managed with Zyrtec.  - environmental skin testing 09/02/24: positive to grass, tree and weed pollen, minor molds; intradermal testing positive to indoor and outdoor molds, dust mites, cat, dog. Allergen avoidance -Consider allergy  injections to reduce lifetime symptoms and need for medications by teaching your immune system to become tolerant of the environmental allergens you are allergic to - continue zyrtec 10 mg daily as needed - Avoid Benadryl  due to potential adverse effects.  Follow up : 6 months, sooner if needed It was a pleasure seeing you again in clinic today! Thank you for allowing me to participate in your care.  Rocky Endow, MD [facility] [place]  It was a pleasure meeting you in clinic today!  DUST MITE AVOIDANCE  MEASURES:  There are three main measures that need and can be taken to avoid house dust mites:  Reduce accumulation of dust in general -reduce furniture, clothing, carpeting, books, stuffed animals, especially in bedroom  Separate yourself from the dust -use pillow and mattress encasements (can be found at stores such as [employer] or online) -avoid direct exposure to air condition flow -use a HEPA filter device, especially in the bedroom; you can also use a HEPA filter vacuum cleaner -wipe dust with a moist towel instead of a dry towel or broom when cleaning  Decrease mites and/or their secretions -wash clothing and linen and stuffed animals at highest temperature possible, at least every 2 weeks -stuffed animals can also be placed in a bag and put in a freezer overnight  Despite the above measures, it is impossible to eliminate dust mites or their allergen completely from your home.  With the above measures the burden of mites in your home can be diminished, with the goal of minimizing your allergic symptoms.  Success will be reached only when implementing and using all means together. Reducing Pollen Exposure  The American Academy of All[employer] , Asthma and [employer] suggests the following steps to reduce your exposure to pollen during allergy  seasons.    Do not hang sheets or clothing out to dry; pollen may collect on these items. Do not mow lawns or spend time around freshly cut grass; mowing stirs up pollen. Keep windows closed at night.  Keep car windows closed while driving. Minimize morning activities outdoors, a time when pollen counts are usually at their highest. Stay indoors as much as possible when pollen counts or humidity is high and on windy days when pollen tends to remain in the air longer. Use air conditioning when possible.  Many air conditioners have filters that trap the pollen spores. Use a HEPA room air filter to remove pollen form the indoor air you  breathe. Control of Mold Allergen   Mold and fungi can grow on a variety of surfaces provided certain temperature and moisture conditions exist.  Outdoor molds grow on plants, decaying vegetation and soil.  The major outdoor mold, Alternaria and Cladosporium, are found in very high numbers during hot and dry conditions.  Generally, a late Summer - Fall peak is seen for common outdoor fungal spores.  Rain will temporarily lower outdoor mold spore count, but counts rise rapidly when the rainy period ends.  The most important indoor molds are Aspergillus and Penicillium.  Dark, humid and poorly ventilated basements are ideal sites for mold growth.  The next most common sites of mold growth are the bathroom and the kitchen.  Outdoor (Seasonal) Mold Control  Use air conditioning and keep windows closed Avoid exposure to decaying vegetation. Avoid leaf raking. Avoid grain handling. Consider wearing a face mask if working in moldy areas.    Indoor (Perennial) Mold Control   Maintain humidity below 50%. Clean washable surfaces with 5% bleach solution. Remove sources e.g. contaminated carpets.   Control of Dog or Cat Allergen  Avoidance is the best way to manage a dog or cat allergy . If you have a dog or cat and are allergic to dog or cats, consider removing the dog or cat from the home. If you have a dog or cat but dont want to find it a new home, or if your family wants a pet even though someone in the household is allergic, here are some strategies that may help keep symptoms at  bay:  Keep the pet out of your bedroom and restrict it to only a few rooms. Be advised that keeping the dog or cat in only one room will not limit the allergens to that room. Dont pet, hug or kiss the dog or cat; if you do, wash your hands with soap and water. High-efficiency particulate air (HEPA) cleaners run continuously in a bedroom or living room can reduce allergen levels over time. Regular use of a  high-efficiency vacuum cleaner or a central vacuum can reduce allergen levels. Giving your dog or cat a bath at least once a week can reduce airborne allergen.   Thank you for allowing me to participate in your care.  Rocky Endow, MD [facility] [place]

## 2025-01-10 ENCOUNTER — Other Ambulatory Visit: Payer: Self-pay

## 2025-01-15 ENCOUNTER — Ambulatory Visit

## 2025-01-22 ENCOUNTER — Encounter

## 2025-07-02 ENCOUNTER — Ambulatory Visit: Admitting: Internal Medicine
